# Patient Record
Sex: Male | Born: 1955
Health system: Southern US, Community
[De-identification: ages and names within clinical notes are randomized; demographics above are authoritative.]

## PROBLEM LIST (undated history)

## (undated) DIAGNOSIS — F909 Attention-deficit hyperactivity disorder, unspecified type: Secondary | ICD-10-CM

## (undated) DIAGNOSIS — E119 Type 2 diabetes mellitus without complications: Secondary | ICD-10-CM

## (undated) DIAGNOSIS — R519 Headache, unspecified: Secondary | ICD-10-CM

## (undated) DIAGNOSIS — R51 Headache: Secondary | ICD-10-CM

## (undated) DIAGNOSIS — Z87442 Personal history of urinary calculi: Secondary | ICD-10-CM

## (undated) HISTORY — PX: OTHER SURGICAL HISTORY: SHX169

---

## 1999-10-20 ENCOUNTER — Encounter: Payer: Self-pay | Admitting: Emergency Medicine

## 1999-10-20 ENCOUNTER — Emergency Department (HOSPITAL_COMMUNITY): Admission: EM | Admit: 1999-10-20 | Discharge: 1999-10-21 | Payer: Self-pay | Admitting: Emergency Medicine

## 2016-02-10 ENCOUNTER — Encounter (HOSPITAL_BASED_OUTPATIENT_CLINIC_OR_DEPARTMENT_OTHER): Payer: Self-pay | Admitting: Emergency Medicine

## 2016-02-10 DIAGNOSIS — Z23 Encounter for immunization: Secondary | ICD-10-CM

## 2016-02-10 DIAGNOSIS — K5720 Diverticulitis of large intestine with perforation and abscess without bleeding: Principal | ICD-10-CM | POA: Diagnosis present

## 2016-02-10 DIAGNOSIS — E860 Dehydration: Secondary | ICD-10-CM | POA: Diagnosis present

## 2016-02-10 DIAGNOSIS — Z79899 Other long term (current) drug therapy: Secondary | ICD-10-CM

## 2016-02-10 DIAGNOSIS — E871 Hypo-osmolality and hyponatremia: Secondary | ICD-10-CM | POA: Diagnosis present

## 2016-02-10 DIAGNOSIS — N400 Enlarged prostate without lower urinary tract symptoms: Secondary | ICD-10-CM | POA: Diagnosis present

## 2016-02-10 NOTE — ED Notes (Signed)
Pt sts he had diarrhea x3 days. Since last night not able to have bm.  Sts he went to [facility] this afternoon and they did x-rays and told him he has an obstruction. Abdominal pain but no nausea or vomiting.  Also c/o HA.

## 2016-02-11 ENCOUNTER — Inpatient Hospital Stay (HOSPITAL_BASED_OUTPATIENT_CLINIC_OR_DEPARTMENT_OTHER)
Admission: EM | Admit: 2016-02-11 | Discharge: 2016-02-14 | DRG: 392 | Disposition: A | Discharge status: Home / Self Care | Payer: Self-pay | Attending: Internal Medicine | Admitting: Internal Medicine

## 2016-02-11 ENCOUNTER — Encounter (HOSPITAL_COMMUNITY): Payer: Self-pay | Admitting: *Deleted

## 2016-02-11 ENCOUNTER — Emergency Department (HOSPITAL_BASED_OUTPATIENT_CLINIC_OR_DEPARTMENT_OTHER): Payer: Self-pay

## 2016-02-11 DIAGNOSIS — K5792 Diverticulitis of intestine, part unspecified, without perforation or abscess without bleeding: Secondary | ICD-10-CM | POA: Diagnosis present

## 2016-02-11 DIAGNOSIS — K5720 Diverticulitis of large intestine with perforation and abscess without bleeding: Principal | ICD-10-CM | POA: Diagnosis present

## 2016-02-11 HISTORY — DX: Headache, unspecified: R51.9

## 2016-02-11 HISTORY — DX: Headache: R51

## 2016-02-11 LAB — URINALYSIS, ROUTINE W REFLEX MICROSCOPIC
Bilirubin Urine: NEGATIVE
Glucose, UA: NEGATIVE mg/dL
Ketones, ur: 15 mg/dL — AB
Leukocytes, UA: NEGATIVE
Nitrite: NEGATIVE
Protein, ur: NEGATIVE mg/dL
Specific Gravity, Urine: >1.046 — ABNORMAL HIGH (ref 1.005–1.030)
pH: 7.5 (ref 5.0–8.0)

## 2016-02-11 LAB — BASIC METABOLIC PANEL
Anion gap: 8 (ref 5–15)
BUN: 16 mg/dL (ref 6–20)
CO2: 23 mmol/L (ref 22–32)
Calcium: 8.5 mg/dL — ABNORMAL LOW (ref 8.9–10.3)
Chloride: 101 mmol/L (ref 101–111)
Creatinine, Ser: 0.76 mg/dL (ref 0.61–1.24)
GFR calc Af Amer: >60 mL/min (ref >60)
GFR calc non Af Amer: >60 mL/min (ref >60)
Glucose, Bld: 133 mg/dL — ABNORMAL HIGH (ref 65–99)
Potassium: 3.8 mmol/L (ref 3.5–5.1)
Sodium: 132 mmol/L — ABNORMAL LOW (ref 135–145)

## 2016-02-11 LAB — CBC WITH DIFFERENTIAL/PLATELET
Basophils Absolute: 0 10*3/uL (ref 0.0–0.1)
Basophils Relative: 0 %
Eosinophils Absolute: 0 10*3/uL (ref 0.0–0.7)
Eosinophils Relative: 0 %
HCT: 43.3 % (ref 39.0–52.0)
Hemoglobin: 15 g/dL (ref 13.0–17.0)
Lymphocytes Relative: 11 %
Lymphs Abs: 1.7 10*3/uL (ref 0.7–4.0)
MCH: 31.1 pg (ref 26.0–34.0)
MCHC: 34.6 g/dL (ref 30.0–36.0)
MCV: 89.8 fL (ref 78.0–100.0)
Monocytes Absolute: 1.4 10*3/uL — ABNORMAL HIGH (ref 0.1–1.0)
Monocytes Relative: 9 %
Neutro Abs: 11.7 10*3/uL — ABNORMAL HIGH (ref 1.7–7.7)
Neutrophils Relative %: 80 %
Platelets: 182 10*3/uL (ref 150–400)
RBC: 4.82 MIL/uL (ref 4.22–5.81)
RDW: 13.2 % (ref 11.5–15.5)
WBC: 14.7 10*3/uL — ABNORMAL HIGH (ref 4.0–10.5)

## 2016-02-11 LAB — URINE MICROSCOPIC-ADD ON: Bacteria, UA: NONE SEEN

## 2016-02-11 LAB — MAGNESIUM: Magnesium: 1.9 mg/dL (ref 1.7–2.4)

## 2016-02-11 MED ORDER — CIPROFLOXACIN IN D5W 400 MG/200ML IV SOLN
400.0000 mg | Freq: Two times a day (BID) | INTRAVENOUS | Status: DC
  Administered 2016-02-11 – 2016-02-13 (×5): 400 mg via INTRAVENOUS
  Filled 2016-02-11 (×7): qty 200

## 2016-02-11 MED ORDER — SODIUM CHLORIDE 0.9% FLUSH
3.0000 mL | Freq: Two times a day (BID) | INTRAVENOUS | Status: DC
  Administered 2016-02-12 – 2016-02-13 (×2): 3 mL via INTRAVENOUS

## 2016-02-11 MED ORDER — DEXTROSE 5 % IV SOLN
2.0000 g | Freq: Once | INTRAVENOUS | Status: AC
  Administered 2016-02-11: 2 g via INTRAVENOUS
  Filled 2016-02-11: qty 2

## 2016-02-11 MED ORDER — FENTANYL CITRATE (PF) 100 MCG/2ML IJ SOLN
50.0000 ug | Freq: Once | INTRAMUSCULAR | Status: AC
  Administered 2016-02-11: 50 ug via INTRAVENOUS
  Filled 2016-02-11: qty 2

## 2016-02-11 MED ORDER — CETYLPYRIDINIUM CHLORIDE 0.05 % MT LIQD
7.0000 mL | Freq: Two times a day (BID) | OROMUCOSAL | Status: DC
  Administered 2016-02-11 – 2016-02-14 (×6): 7 mL via OROMUCOSAL

## 2016-02-11 MED ORDER — ONDANSETRON HCL 4 MG PO TABS: 4.0000 mg | ORAL_TABLET | Freq: Four times a day (QID) | ORAL | Status: DC | PRN

## 2016-02-11 MED ORDER — ACETAMINOPHEN 325 MG PO TABS
650.0000 mg | ORAL_TABLET | Freq: Once | ORAL | Status: AC | PRN
  Administered 2016-02-11: 650 mg via ORAL
  Filled 2016-02-11: qty 2

## 2016-02-11 MED ORDER — METRONIDAZOLE IN NACL 5-0.79 MG/ML-% IV SOLN
500.0000 mg | Freq: Once | INTRAVENOUS | Status: AC
  Administered 2016-02-11: 500 mg via INTRAVENOUS
  Filled 2016-02-11: qty 100

## 2016-02-11 MED ORDER — INFLUENZA VAC SPLIT QUAD 0.5 ML IM SUSY
0.5000 mL | PREFILLED_SYRINGE | INTRAMUSCULAR | Status: AC
  Administered 2016-02-12: 0.5 mL via INTRAMUSCULAR
  Filled 2016-02-11: qty 0.5

## 2016-02-11 MED ORDER — IOHEXOL 300 MG/ML  SOLN
100.0000 mL | Freq: Once | INTRAMUSCULAR | Status: AC | PRN
  Administered 2016-02-11: 100 mL via INTRAVENOUS

## 2016-02-11 MED ORDER — MORPHINE SULFATE (PF) 2 MG/ML IV SOLN
1.0000 mg | INTRAVENOUS | Status: DC | PRN
  Administered 2016-02-11 – 2016-02-12 (×2): 1 mg via INTRAVENOUS
  Filled 2016-02-11 (×2): qty 1

## 2016-02-11 MED ORDER — SODIUM CHLORIDE 0.9 % IV SOLN
INTRAVENOUS | Status: DC
  Administered 2016-02-11 – 2016-02-13 (×4): via INTRAVENOUS

## 2016-02-11 MED ORDER — ENOXAPARIN SODIUM 40 MG/0.4ML ~~LOC~~ SOLN
40.0000 mg | SUBCUTANEOUS | Status: DC
  Administered 2016-02-11 – 2016-02-13 (×3): 40 mg via SUBCUTANEOUS
  Filled 2016-02-11 (×3): qty 0.4

## 2016-02-11 MED ORDER — METRONIDAZOLE IN NACL 5-0.79 MG/ML-% IV SOLN
500.0000 mg | Freq: Three times a day (TID) | INTRAVENOUS | Status: DC
  Administered 2016-02-11 – 2016-02-13 (×6): 500 mg via INTRAVENOUS
  Filled 2016-02-11 (×8): qty 100

## 2016-02-11 MED ORDER — ONDANSETRON HCL 4 MG/2ML IJ SOLN: 4.0000 mg | Freq: Four times a day (QID) | INTRAMUSCULAR | Status: DC | PRN

## 2016-02-11 MED ORDER — IOHEXOL 300 MG/ML  SOLN
25.0000 mL | Freq: Once | INTRAMUSCULAR | Status: AC | PRN
  Administered 2016-02-11: 25 mL via ORAL

## 2016-02-11 MED ORDER — ONDANSETRON HCL 4 MG/2ML IJ SOLN: 4.0000 mg | Freq: Three times a day (TID) | INTRAMUSCULAR | Status: DC | PRN

## 2016-02-11 MED ORDER — ACETAMINOPHEN 650 MG RE SUPP: 650.0000 mg | Freq: Four times a day (QID) | RECTAL | Status: DC | PRN

## 2016-02-11 MED ORDER — SENNOSIDES-DOCUSATE SODIUM 8.6-50 MG PO TABS: 1.0000 | ORAL_TABLET | Freq: Every evening | ORAL | Status: DC | PRN

## 2016-02-11 MED ORDER — SODIUM CHLORIDE 0.9 % IV SOLN
INTRAVENOUS | Status: AC
  Administered 2016-02-11: 06:00:00 via INTRAVENOUS

## 2016-02-11 MED ORDER — HYDROMORPHONE HCL 1 MG/ML IJ SOLN
1.0000 mg | INTRAMUSCULAR | Status: AC | PRN
  Administered 2016-02-11 (×2): 1 mg via INTRAVENOUS
  Filled 2016-02-11 (×2): qty 1

## 2016-02-11 MED ORDER — LEVALBUTEROL HCL 0.63 MG/3ML IN NEBU: 0.6300 mg | INHALATION_SOLUTION | Freq: Four times a day (QID) | RESPIRATORY_TRACT | Status: DC | PRN

## 2016-02-11 MED ORDER — ACETAMINOPHEN 325 MG PO TABS
650.0000 mg | ORAL_TABLET | Freq: Four times a day (QID) | ORAL | Status: DC | PRN
  Administered 2016-02-12: 650 mg via ORAL
  Filled 2016-02-11: qty 2

## 2016-02-11 MED ORDER — METRONIDAZOLE IN NACL 5-0.79 MG/ML-% IV SOLN
500.0000 mg | Freq: Three times a day (TID) | INTRAVENOUS | Status: DC
  Filled 2016-02-11 (×2): qty 100

## 2016-02-11 MED ORDER — ONDANSETRON HCL 4 MG/2ML IJ SOLN
4.0000 mg | Freq: Once | INTRAMUSCULAR | Status: AC
Start: 2016-02-11 — End: 2016-02-11
  Administered 2016-02-11: 4 mg via INTRAVENOUS
  Filled 2016-02-11: qty 2

## 2016-02-11 NOTE — ED Provider Notes (Addendum)
CSN: [id number]     Arrival date & time 02/10/16  2254 History   First MD Initiated Contact with Patient 02/11/16 [phone]     Chief Complaint  Patient presents with  . Abdominal Pain     (Consider location/radiation/quality/duration/timing/severity/associated sxs/prior Treatment) HPI  This is a 60 year old male who was treated for what sounds like prostatitis in December of last year. He was treated with Flomax and an unspecified antibiotic. Since that time he has had loose stools which he does not characterizes frank diarrhea. Stools have gotten looser over the past month but has now not had a bowel movement in over a day. He is here with lower abdominal pain that began yesterday. He is having difficulty characterizing or quantifying the pain. He was seen at an urgent care where an acute abdominal series was obtained and he was told he may have a blockage. He was sent to the ED for further evaluation. He has not been nauseated or vomiting. He did have a headache that was relieved with Tylenol. He has not been passing blood. Pain is worse with movement or palpation. He was noted to have a low-grade fever on arrival. He also complains of no appetite for the last day.  Past Medical History  Diagnosis Date  . Headache    History reviewed. No pertinent past surgical history. No family history on file. Social History  Substance Use Topics  . Smoking status: Never Smoker   . Smokeless tobacco: None  . Alcohol Use: Yes     Comment: very little    Review of Systems  All other systems reviewed and are negative.   Allergies  Review of patient's allergies indicates no known allergies.  Home Medications   Prior to Admission medications   Medication Sig Start Date End Date Taking? Authorizing Provider  amphetamine-dextroamphetamine (ADDERALL) 15 MG tablet Take 15 mg by mouth 3 (three) times daily as needed.   Yes Historical Provider, MD  traZODone (DESYREL) 100 MG tablet Take 100 mg by mouth  at bedtime.   Yes Historical Provider, MD   BP 121/62 mmHg  Pulse 90  Temp(Src) 98.7 F (37.1 C) (Oral)  Resp 16  Ht 5\' 7"  (1.702 m)  Wt 203 lb (92.08 kg)  BMI 31.79 kg/m2  SpO2 97%   Physical Exam  General: Well-developed, well-nourished male in no acute distress; appearance consistent with age of record HENT: normocephalic; atraumatic Eyes: pupils equal, round and reactive to light; extraocular muscles intact Neck: supple Heart: regular rate and rhythm Lungs: clear to auscultation bilaterally Abdomen: soft; nondistended; lower abdominal tenderness; no masses or hepatosplenomegaly; bowel sounds present Extremities: No deformity; full range of motion; pulses normal Neurologic: Awake, alert and oriented; motor function intact in all extremities and symmetric; no facial droop Skin: Warm and dry Psychiatric: Normal mood and affect    ED Course  Procedures (including critical care time)   MDM   Nursing notes and vitals signs, including pulse oximetry, reviewed.  Summary of this visit's results, reviewed by myself:  Labs:  Results for orders placed or performed during the hospital encounter of 02/11/16 (from the past 24 hour(s))  CBC with Differential     Status: Abnormal   Collection Time: 02/11/16  1:20 AM  Result Value Ref Range   WBC 14.7 (H) 4.0 - 10.5 K/uL   RBC 4.82 4.22 - 5.81 MIL/uL   Hemoglobin 15.0 13.0 - 17.0 g/dL   HCT 29.5 62.1 - 30.8 %   MCV 89.8 78.0 -  100.0 fL   MCH 31.1 26.0 - 34.0 pg   MCHC 34.6 30.0 - 36.0 g/dL   RDW 16.1 09.6 - 04.5 %   Platelets 182 150 - 400 K/uL   Neutrophils Relative % 80 %   Neutro Abs 11.7 (H) 1.7 - 7.7 K/uL   Lymphocytes Relative 11 %   Lymphs Abs 1.7 0.7 - 4.0 K/uL   Monocytes Relative 9 %   Monocytes Absolute 1.4 (H) 0.1 - 1.0 K/uL   Eosinophils Relative 0 %   Eosinophils Absolute 0.0 0.0 - 0.7 K/uL   Basophils Relative 0 %   Basophils Absolute 0.0 0.0 - 0.1 K/uL  Basic metabolic panel     Status: Abnormal    Collection Time: 02/11/16  1:20 AM  Result Value Ref Range   Sodium 132 (L) 135 - 145 mmol/L   Potassium 3.8 3.5 - 5.1 mmol/L   Chloride 101 101 - 111 mmol/L   CO2 23 22 - 32 mmol/L   Glucose, Bld 133 (H) 65 - 99 mg/dL   BUN 16 6 - 20 mg/dL   Creatinine, Ser 4.09 0.61 - 1.24 mg/dL   Calcium 8.5 (L) 8.9 - 10.3 mg/dL   GFR calc non Af Amer >60 >60 mL/min   GFR calc Af Amer >60 >60 mL/min   Anion gap 8 5 - 15    Imaging Studies: Ct Abdomen Pelvis W Contrast  02/11/2016  CLINICAL DATA:  Acute onset of diarrhea. Generalized lower abdominal pain and headache. Initial encounter. EXAM: CT ABDOMEN AND PELVIS WITH CONTRAST TECHNIQUE: Multidetector CT imaging of the abdomen and pelvis was performed using the standard protocol following bolus administration of intravenous contrast. CONTRAST:  OMNIPAQUE IOHEXOL 300 MG/ML  SOLN COMPARISON:  None. FINDINGS: The visualized lung bases are clear. The liver and spleen are unremarkable in appearance. The gallbladder is within normal limits. The pancreas and adrenal glands are unremarkable. Bilateral renal cysts are noted, measuring up to 4.0 cm in size. There is no evidence of hydronephrosis. No renal or ureteral stones are seen. No perinephric stranding is appreciated. No free fluid is identified. The small bowel is unremarkable in appearance. The stomach is within normal limits. No acute vascular abnormalities are seen. Minimal calcification is noted along the abdominal aorta and its branches. The appendix is normal in caliber, without evidence of appendicitis. Focal soft tissue inflammation and wall thickening are noted at the mid sigmoid colon, with a small amount of associated free fluid, compatible with acute diverticulitis. Several small foci of free air are seen tracking proximally along the mesentery, compatible with microperforation. The bladder is mildly distended and grossly unremarkable. The prostate remains normal in size. No inguinal  lymphadenopathy is seen. No acute osseous abnormalities are identified. Mild disc space narrowing is noted at L4-L5. IMPRESSION: 1. Acute diverticulitis at the mid sigmoid colon, with focal soft tissue inflammation and wall thickening, and small amount of associated free fluid. Several small foci of free air tracking proximally along the mesentery, compatible with microperforation. 2. Bilateral renal cysts noted. These results were called by telephone at the time of interpretation on 02/11/2016 at 5:09 am to Dr. Paula Libra, who verbally acknowledged these results. Electronically Signed   By: Roanna Raider M.D.   On: 02/11/2016 05:10   5:48 AM Rocephin and Flagyl IV ordered for diverticulitis with microperforation. Dr. Della Goo of [facility] will admit the patient. General surgery has been consulted as well.    Paula Libra, MD 02/11/16 [phone]  Paula Libra, MD 02/11/16 [phone]

## 2016-02-11 NOTE — Consult Note (Signed)
Reason for Consult:  Diverticulitis with microperforation Referring Physician: N Abrol   Dale Hines is an 59 y.o. male.  HPI: Pt presented with with was thought to be prostatitis 11/2015.  He reports loose stools since that time.  Yesterday he developed lower abdominal pain and was seen at an Urgent Care.  Films reported to show a "blockage."  He presented to [facility] for evaluation. Work up shows a low grade fever 100.6, other VSS. BMP OK.  WBC is 14.7.  CT scan shows Acute diverticulitis at the mid sigmoid colon, with focal soft tissue inflammation and wall thickening, and small amount of associated free fluid. Several small foci of free air tracking proximally along the mesentery, compatible with microperforation.  He is transferred to [facility] and we are seeing in consult.  Past Medical History  Diagnosis Date  . Headache     History reviewed. No pertinent past surgical history.  No family history on file.  Social History:  reports that he has never smoked. He does not have any smokeless tobacco history on file. He reports that he drinks alcohol. He reports that he does not use illicit drugs.  Allergies: No Known Allergies  Prior to Admission medications   Medication Sig Start Date End Date Taking? Authorizing Provider  amphetamine-dextroamphetamine (ADDERALL) 15 MG tablet Take 15 mg by mouth 3 (three) times daily as needed.   Yes Historical Provider, MD  traZODone (DESYREL) 100 MG tablet Take 100 mg by mouth at bedtime.   Yes Historical Provider, MD     Results for orders placed or performed during the hospital encounter of 02/11/16 (from the past 48 hour(s))  CBC with Differential     Status: Abnormal   Collection Time: 02/11/16  1:20 AM  Result Value Ref Range   WBC 14.7 (H) 4.0 - 10.5 K/uL   RBC 4.82 4.22 - 5.81 MIL/uL   Hemoglobin 15.0 13.0 - 17.0 g/dL   HCT 43.3 39.0 - 52.0 %   MCV 89.8 78.0 - 100.0 fL   MCH 31.1 26.0 - 34.0 pg   MCHC 34.6 30.0 - 36.0 g/dL   RDW 13.2  11.5 - 15.5 %   Platelets 182 150 - 400 K/uL   Neutrophils Relative % 80 %   Neutro Abs 11.7 (H) 1.7 - 7.7 K/uL   Lymphocytes Relative 11 %   Lymphs Abs 1.7 0.7 - 4.0 K/uL   Monocytes Relative 9 %   Monocytes Absolute 1.4 (H) 0.1 - 1.0 K/uL   Eosinophils Relative 0 %   Eosinophils Absolute 0.0 0.0 - 0.7 K/uL   Basophils Relative 0 %   Basophils Absolute 0.0 0.0 - 0.1 K/uL  Basic metabolic panel     Status: Abnormal   Collection Time: 02/11/16  1:20 AM  Result Value Ref Range   Sodium 132 (L) 135 - 145 mmol/L   Potassium 3.8 3.5 - 5.1 mmol/L   Chloride 101 101 - 111 mmol/L   CO2 23 22 - 32 mmol/L   Glucose, Bld 133 (H) 65 - 99 mg/dL   BUN 16 6 - 20 mg/dL   Creatinine, Ser 0.76 0.61 - 1.24 mg/dL   Calcium 8.5 (L) 8.9 - 10.3 mg/dL   GFR calc non Af Amer >60 >60 mL/min   GFR calc Af Amer >60 >60 mL/min    Comment: (NOTE) The eGFR has been calculated using the CKD EPI equation. This calculation has not been validated in all clinical situations. eGFR's persistently <60 mL/min signify possible Chronic   Kidney Disease.    Anion gap 8 5 - 15  Urinalysis, Routine w reflex microscopic (not at [facility])     Status: Abnormal   Collection Time: 02/11/16  8:20 AM  Result Value Ref Range   Color, Urine YELLOW YELLOW   APPearance CLEAR CLEAR   Specific Gravity, Urine >1.046 (H) 1.005 - 1.030   pH 7.5 5.0 - 8.0   Glucose, UA NEGATIVE NEGATIVE mg/dL   Hgb urine dipstick TRACE (A) NEGATIVE   Bilirubin Urine NEGATIVE NEGATIVE   Ketones, ur 15 (A) NEGATIVE mg/dL   Protein, ur NEGATIVE NEGATIVE mg/dL   Nitrite NEGATIVE NEGATIVE   Leukocytes, UA NEGATIVE NEGATIVE  Urine microscopic-add on     Status: Abnormal   Collection Time: 02/11/16  8:20 AM  Result Value Ref Range   Squamous Epithelial / LPF 0-5 (A) NONE SEEN   WBC, UA 0-5 0 - 5 WBC/hpf   RBC / HPF 0-5 0 - 5 RBC/hpf   Bacteria, UA NONE SEEN NONE SEEN  Magnesium     Status: None   Collection Time: 02/11/16  8:23 AM  Result Value Ref  Range   Magnesium 1.9 1.7 - 2.4 mg/dL    Ct Abdomen Pelvis W Contrast  02/11/2016  CLINICAL DATA:  Acute onset of diarrhea. Generalized lower abdominal pain and headache. Initial encounter. EXAM: CT ABDOMEN AND PELVIS WITH CONTRAST TECHNIQUE: Multidetector CT imaging of the abdomen and pelvis was performed using the standard protocol following bolus administration of intravenous contrast. CONTRAST:  100mL OMNIPAQUE IOHEXOL 300 MG/ML  SOLN COMPARISON:  None. FINDINGS: The visualized lung bases are clear. The liver and spleen are unremarkable in appearance. The gallbladder is within normal limits. The pancreas and adrenal glands are unremarkable. Bilateral renal cysts are noted, measuring up to 4.0 cm in size. There is no evidence of hydronephrosis. No renal or ureteral stones are seen. No perinephric stranding is appreciated. No free fluid is identified. The small bowel is unremarkable in appearance. The stomach is within normal limits. No acute vascular abnormalities are seen. Minimal calcification is noted along the abdominal aorta and its branches. The appendix is normal in caliber, without evidence of appendicitis. Focal soft tissue inflammation and wall thickening are noted at the mid sigmoid colon, with a small amount of associated free fluid, compatible with acute diverticulitis. Several small foci of free air are seen tracking proximally along the mesentery, compatible with microperforation. The bladder is mildly distended and grossly unremarkable. The prostate remains normal in size. No inguinal lymphadenopathy is seen. No acute osseous abnormalities are identified. Mild disc space narrowing is noted at L4-L5. IMPRESSION: 1. Acute diverticulitis at the mid sigmoid colon, with focal soft tissue inflammation and wall thickening, and small amount of associated free fluid. Several small foci of free air tracking proximally along the mesentery, compatible with microperforation. 2. Bilateral renal cysts  noted. These results were called by telephone at the time of interpretation on 02/11/2016 at 5:09 am to Dr. JOHN MOLPUS, who verbally acknowledged these results. Electronically Signed   By: Jeffery  Chang M.D.   On: 02/11/2016 05:10    Review of Systems  Constitutional: Positive for fever. Negative for weight loss and malaise/fatigue.  Eyes: Negative.   Respiratory: Negative.   Cardiovascular: Negative.   Gastrointestinal: Positive for abdominal pain and diarrhea. Negative for heartburn, nausea, vomiting, constipation, blood in stool and melena.  Genitourinary: Negative.   Musculoskeletal: Negative.   Skin: Negative.   Neurological: Positive for headaches.  Endo/Heme/Allergies: Negative.     Psychiatric/Behavioral: Negative.    Blood pressure 128/76, pulse 94, temperature 98 F (36.7 C), temperature source Oral, resp. rate 18, height 5' 6" (1.676 m), weight 88.5 kg (195 lb 1.7 oz), SpO2 100 %. Physical Exam  Constitutional: He is oriented to person, place, and time. He appears well-developed and well-nourished. No distress.  HENT:  Head: Normocephalic and atraumatic.  Nose: Nose normal.  Eyes: Conjunctivae and EOM are normal. Right eye exhibits no discharge. Left eye exhibits no discharge. No scleral icterus.  Neck: Normal range of motion. Neck supple. No JVD present. No tracheal deviation present. No thyromegaly present.  Cardiovascular: Normal rate, regular rhythm, normal heart sounds and intact distal pulses.   No murmur heard. Respiratory: Effort normal and breath sounds normal. No respiratory distress. He has no wheezes. He has no rales. He exhibits no tenderness.  GI: Soft. He exhibits no distension and no mass. There is tenderness. There is no rebound and no guarding.  Musculoskeletal: He exhibits no edema or tenderness.  Lymphadenopathy:    He has no cervical adenopathy.  Neurological: He is alert and oriented to person, place, and time. No cranial nerve deficit.  Skin: Skin is  warm and dry. No rash noted. He is not diaphoretic. No erythema. No pallor.  Psychiatric: He has a normal mood and affect. His behavior is normal. Judgment and thought content normal.    Assessment/Plan: Acute sigmoid diverticulitis with microperforation Recent history of antibiotics for prostatitis with loose stools  Plan:  He is going to be tested for C diff colitis, and treated with antibiotics for diverticulitis .  Agree with plan and we will follow.    Hines,Dale 02/11/2016, 9:46 AM      

## 2016-02-11 NOTE — ED Notes (Signed)
[facility] here, pt resting, NAD, calm, interactive. No changes.

## 2016-02-11 NOTE — ED Notes (Signed)
Dr. Molpus in to see pt 

## 2016-02-11 NOTE — ED Notes (Signed)
Up to b/r, steady gait 

## 2016-02-11 NOTE — ED Notes (Signed)
Alert, NAD, calm, interactive, resps e/u, speaking in clear complete sentences, VSS, c/o abd pain and diarrhea (stools black), also HA. (denies: nv, fever, urinary sx, bleeding, sob, dizziness or other sx), family at [facility].

## 2016-02-11 NOTE — ED Notes (Signed)
Denies pain or nausea 

## 2016-02-11 NOTE — H&P (Addendum)
[facility] History and Physical  Dale Hines ZOX:[id number] DOB: Jun 28, 1956 DOA: 02/11/2016  Referring physician:    PCP: No PCP Per Patient   Chief Complaint:  Diarrhea 3 days  HPI:  60 year old male with no significant past medical history except for history of BPH treated with Flomax and he now presents to the ER with abdominal pain. Patient has had loose stools 3 days. Pain is described as lower left abdominal pain, associated symptoms include a low-grade fever. CT scan showed acute diverticulitis of the mid sigmoid colon with focal soft tissue inflammation and wall thickening, associated with microperforation, white count of 14,000.Patient has never had a colonoscopy.      Review of Systems: negative for the following  Constitutional: Denies fever, chills, diaphoresis, appetite change and fatigue.  HEENT: Denies photophobia, eye pain, redness, hearing loss, ear pain, congestion, sore throat, rhinorrhea, sneezing, mouth sores, trouble swallowing, neck pain, neck stiffness and tinnitus.  Respiratory: Denies SOB, DOE, cough, chest tightness, and wheezing.  Cardiovascular: Denies chest pain, palpitations and leg swelling.  Gastrointestinal: Denies nausea, vomiting, positive for abdominal pain, diarrhea, constipation, blood in stool and abdominal distention.  Genitourinary: Denies dysuria, urgency, frequency, hematuria, flank pain and difficulty urinating.  Musculoskeletal: Denies myalgias, back pain, joint swelling, arthralgias and gait problem.  Skin: Denies pallor, rash and wound.  Neurological: Denies dizziness, seizures, syncope, weakness, light-headedness, numbness and headaches.  Hematological: Denies adenopathy. Easy bruising, personal or family bleeding history  Psychiatric/Behavioral: Denies suicidal ideation, mood changes, confusion, nervousness, sleep disturbance and agitation       Past Medical History  Diagnosis Date  . Headache      History  reviewed. No pertinent past surgical history.     Social History:  reports that he has never smoked. He does not have any smokeless tobacco history on file. He reports that he drinks alcohol. He reports that he does not use illicit drugs.   No Known Allergies      FAMILY HISTORY  When questioned  Directly-patient reports  No family history of HTN, CVA ,DIABETES, TB, Cancer CAD, Bleeding Disorders, Sickle Cell, diabetes, anemia, asthma,   Prior to Admission medications   Medication Sig Start Date End Date Taking? Authorizing Provider  amphetamine-dextroamphetamine (ADDERALL) 15 MG tablet Take 15 mg by mouth 3 (three) times daily as needed.   Yes Historical Provider, MD  traZODone (DESYREL) 100 MG tablet Take 100 mg by mouth at bedtime.   Yes Historical Provider, MD     Physical Exam: Filed Vitals:   02/11/16 0550 02/11/16 0600 02/11/16 0615 02/11/16 0703  BP:  118/63 133/76 128/76  Pulse:  96 95 94  Temp: 99.7 F (37.6 C)   98 F (36.7 C)  TempSrc: Oral   Oral  Resp:    18  Height:     (1.676 m)  Weight:    88.5 kg (195 lb 1.7 oz)  SpO2:  96% 97% 100%     Constitutional: Vital signs reviewed. Patient is a well-developed and well-nourished in no acute distress and cooperative with exam. Alert and oriented x3.  Head: Normocephalic and atraumatic  Ear: TM normal bilaterally  Mouth: no erythema or exudates, MMM  Eyes: PERRL, EOMI, conjunctivae normal, No scleral icterus.  Neck: Supple, Trachea midline normal ROM, No JVD, mass, thyromegaly, or carotid bruit present.  Cardiovascular: RRR, S1 normal, S2 normal, no MRG, pulses symmetric and intact bilaterally  Pulmonary/Chest: CTAB, no wheezes, rales, or rhonchi  Abdominal: Tender to palpation in the left  lower quadrant., non-distended, bowel sounds are normal, no masses, organomegaly, or guarding present.  GU: no CVA tenderness Musculoskeletal: No joint deformities, erythema, or stiffness, ROM full and no nontender Ext: no  edema and no cyanosis, pulses palpable bilaterally (DP and PT)  Hematology: no cervical, inginal, or axillary adenopathy.  Neurological: A&O x3, Strenght is normal and symmetric bilaterally, cranial nerve II-XII are grossly intact, no focal motor deficit, sensory intact to light touch bilaterally.  Skin: Warm, dry and intact. No rash, cyanosis, or clubbing.  Psychiatric: Normal mood and affect. speech and behavior is normal. Judgment and thought content normal. Cognition and memory are normal.      Data Review   Micro Results No results found for this or any previous visit (from the past 240 hour(s)).  Radiology Reports Ct Abdomen Pelvis W Contrast  02/11/2016  CLINICAL DATA:  Acute onset of diarrhea. Generalized lower abdominal pain and headache. Initial encounter. EXAM: CT ABDOMEN AND PELVIS WITH CONTRAST TECHNIQUE: Multidetector CT imaging of the abdomen and pelvis was performed using the standard protocol following bolus administration of intravenous contrast. CONTRAST:  OMNIPAQUE IOHEXOL 300 MG/ML  SOLN COMPARISON:  None. FINDINGS: The visualized lung bases are clear. The liver and spleen are unremarkable in appearance. The gallbladder is within normal limits. The pancreas and adrenal glands are unremarkable. Bilateral renal cysts are noted, measuring up to 4.0 cm in size. There is no evidence of hydronephrosis. No renal or ureteral stones are seen. No perinephric stranding is appreciated. No free fluid is identified. The small bowel is unremarkable in appearance. The stomach is within normal limits. No acute vascular abnormalities are seen. Minimal calcification is noted along the abdominal aorta and its branches. The appendix is normal in caliber, without evidence of appendicitis. Focal soft tissue inflammation and wall thickening are noted at the mid sigmoid colon, with a small amount of associated free fluid, compatible with acute diverticulitis. Several small foci of free air are seen  tracking proximally along the mesentery, compatible with microperforation. The bladder is mildly distended and grossly unremarkable. The prostate remains normal in size. No inguinal lymphadenopathy is seen. No acute osseous abnormalities are identified. Mild disc space narrowing is noted at L4-L5. IMPRESSION: 1. Acute diverticulitis at the mid sigmoid colon, with focal soft tissue inflammation and wall thickening, and small amount of associated free fluid. Several small foci of free air tracking proximally along the mesentery, compatible with microperforation. 2. Bilateral renal cysts noted. These results were called by telephone at the time of interpretation on 02/11/2016 at 5:09 am to Dr. Paula Libra, who verbally acknowledged these results. Electronically Signed   By: Roanna Raider M.D.   On: 02/11/2016 05:10     CBC  Recent Labs Lab 02/11/16 0120  WBC 14.7*  HGB 15.0  HCT 43.3  PLT 182  MCV 89.8  MCH 31.1  MCHC 34.6  RDW 13.2  LYMPHSABS 1.7  MONOABS 1.4*  EOSABS 0.0  BASOSABS 0.0    Chemistries   Recent Labs Lab 02/11/16 0120  NA 132*  K 3.8  CL 101  CO2 23  GLUCOSE 133*  BUN 16  CREATININE 0.76  CALCIUM 8.5*   ------------------------------------------------------------------------------------------------------------------ estimated creatinine clearance is 101.1 mL/min (by C-G formula based on Cr of 0.76). ------------------------------------------------------------------------------------------------------------------ No results for input(s): HGBA1C in the last 72 hours. ------------------------------------------------------------------------------------------------------------------ No results for input(s): CHOL, HDL, LDLCALC, TRIG, CHOLHDL, LDLDIRECT in the last 72 hours. ------------------------------------------------------------------------------------------------------------------ No results for input(s): TSH, T4TOTAL, T3FREE, THYROIDAB in the last 72  hours.  Invalid input(s): FREET3 ------------------------------------------------------------------------------------------------------------------ No results for input(s): VITAMINB12, FOLATE, FERRITIN, TIBC, IRON, RETICCTPCT in the last 72 hours.  Coagulation profile No results for input(s): INR, PROTIME in the last 168 hours.  No results for input(s): DDIMER in the last 72 hours.  Cardiac Enzymes No results for input(s): CKMB, TROPONINI, MYOGLOBIN in the last 168 hours.  Invalid input(s): CK ------------------------------------------------------------------------------------------------------------------ Invalid input(s): POCBNP   CBG: No results for input(s): GLUCAP in the last 168 hours.     EKG: Independently reviewed.   Assessment/Plan Active Problems: Acute diverticulitis with microperforation Patient will be admitted to MedSurg Patient started on Rocephin and Flagyl We will continue patient on ciprofloxacin and Flagyl IV fluids, clear liquid diet Repeat CT scan if the patient's symptoms worsen during this hospitalization  general surgery consultation has been obtained Stool studies to rule out C. difficile, GI pathogen panel  Hyponatremia likely secondary to dehydration, diarrhea       Code Status Orders        Start     Ordered   02/11/16 0738  Full code   Continuous     02/11/16 0742    Code Status History    Date Active Date Inactive Code Status Order ID Comments User Context   This patient has a current code status but no historical code status.      Family Communication: bedside Disposition Plan: admit   Total time spent 55 minutes.Greater than 50% of this time was spent in counseling, explanation of diagnosis, planning of further management, and coordination of care  [facility] Pager [phone]  If 7PM-7AM, please contact night-coverage [email or web address] Password [email or web address] 02/11/2016, 8:09 AM

## 2016-02-11 NOTE — ED Notes (Signed)
Pt to CT

## 2016-02-12 LAB — COMPREHENSIVE METABOLIC PANEL
ALT: 20 U/L (ref 17–63)
ANION GAP: 9 (ref 5–15)
AST: 19 U/L (ref 15–41)
Albumin: 3.2 g/dL — ABNORMAL LOW (ref 3.5–5.0)
Alkaline Phosphatase: 76 U/L (ref 38–126)
BILIRUBIN TOTAL: 1.2 mg/dL (ref 0.3–1.2)
BUN: 10 mg/dL (ref 6–20)
CO2: 25 mmol/L (ref 22–32)
CREATININE: 0.94 mg/dL (ref 0.61–1.24)
Calcium: 8.6 mg/dL — ABNORMAL LOW (ref 8.9–10.3)
Chloride: 100 mmol/L — ABNORMAL LOW (ref 101–111)
Glucose, Bld: 103 mg/dL — ABNORMAL HIGH (ref 65–99)
POTASSIUM: 3.8 mmol/L (ref 3.5–5.1)
Sodium: 134 mmol/L — ABNORMAL LOW (ref 135–145)
Total Protein: 6.7 g/dL (ref 6.5–8.1)

## 2016-02-12 LAB — CBC
HEMATOCRIT: 42.3 % (ref 39.0–52.0)
HEMOGLOBIN: 14.1 g/dL (ref 13.0–17.0)
MCH: 29.9 pg (ref 26.0–34.0)
MCHC: 33.3 g/dL (ref 30.0–36.0)
MCV: 89.8 fL (ref 78.0–100.0)
PLATELETS: 178 10*3/uL (ref 150–400)
RBC: 4.71 MIL/uL (ref 4.22–5.81)
RDW: 13 % (ref 11.5–15.5)
WBC: 10.4 10*3/uL (ref 4.0–10.5)

## 2016-02-12 LAB — GASTROINTESTINAL PANEL BY PCR, STOOL (REPLACES STOOL CULTURE)
ADENOVIRUS F40/41: NOT DETECTED
Astrovirus: NOT DETECTED
CAMPYLOBACTER SPECIES: NOT DETECTED
CRYPTOSPORIDIUM: NOT DETECTED
CYCLOSPORA CAYETANENSIS: NOT DETECTED
E. coli O157: NOT DETECTED
ENTEROAGGREGATIVE E COLI (EAEC): NOT DETECTED
ENTEROPATHOGENIC E COLI (EPEC): NOT DETECTED
Entamoeba histolytica: NOT DETECTED
Enterotoxigenic E coli (ETEC): NOT DETECTED
GIARDIA LAMBLIA: NOT DETECTED
Norovirus GI/GII: NOT DETECTED
PLESIMONAS SHIGELLOIDES: NOT DETECTED
Rotavirus A: NOT DETECTED
Salmonella species: NOT DETECTED
Sapovirus (I, II, IV, and V): NOT DETECTED
Shiga like toxin producing E coli (STEC): NOT DETECTED
Shigella/Enteroinvasive E coli (EIEC): NOT DETECTED
VIBRIO CHOLERAE: NOT DETECTED
VIBRIO SPECIES: NOT DETECTED
YERSINIA ENTEROCOLITICA: NOT DETECTED

## 2016-02-12 LAB — C DIFFICILE QUICK SCREEN W PCR REFLEX
C DIFFICILE (CDIFF) INTERP: NEGATIVE
C Diff antigen: NEGATIVE
C Diff toxin: NEGATIVE

## 2016-02-12 LAB — URINE CULTURE: Culture: NO GROWTH

## 2016-02-12 MED ORDER — DOCUSATE SODIUM 100 MG PO CAPS
100.0000 mg | ORAL_CAPSULE | Freq: Every day | ORAL | Status: DC
  Administered 2016-02-12 – 2016-02-14 (×3): 100 mg via ORAL
  Filled 2016-02-12 (×3): qty 1

## 2016-02-12 NOTE — Progress Notes (Signed)
Utilization review completed.  

## 2016-02-12 NOTE — Progress Notes (Signed)
Subjective: No further diarrhea, tender over mid abdomen below the umbilicus.  No nausea on clears  Objective: Vital signs in last 24 hours: Temp:  [98.6 F (37 C)-100.4 F (38 C)] 98.6 F (37 C) (02/27 0540) Pulse Rate:  [81-97] 81 (02/27 0540) Resp:  [17-18] 17 (02/27 0540) BP: (119-130)/(64-72) 124/72 mmHg (02/27 0540) SpO2:  [96 %-99 %] 99 % (02/27 0540) Last BM Date: 02/08/16 PO 1540\clear diet Urine 500 recorded TM 100.4 @ 1900 Labs OK Intake/Output from previous day: 02/26 0701 - 02/27 0700 In: 3592.1 [P.O.:1540; I.V.:1702.1; IV Piggyback:300] Out: 400 [Urine:400] Intake/Output this shift:    General appearance: alert, cooperative and no distress GI: soft, tender mid lower abdomen below the umbilicus  Lab Results:   Recent Labs  02/11/16 0120 02/12/16 0553  WBC 14.7* 10.4  HGB 15.0 14.1  HCT 43.3 42.3  PLT 182 178    BMET  Recent Labs  02/11/16 0120 02/12/16 0553  NA 132* 134*  K 3.8 3.8  CL 101 100*  CO2 23 25  GLUCOSE 133* 103*  BUN 16 10  CREATININE 0.76 0.94  CALCIUM 8.5* 8.6*   PT/INR No results for input(s): LABPROT, INR in the last 72 hours.   Recent Labs Lab 02/12/16 0553  AST 19  ALT 20  ALKPHOS 76  BILITOT 1.2  PROT 6.7  ALBUMIN 3.2*     Lipase  No results found for: LIPASE   Studies/Results: Ct Abdomen Pelvis W Contrast  02/11/2016  CLINICAL DATA:  Acute onset of diarrhea. Generalized lower abdominal pain and headache. Initial encounter. EXAM: CT ABDOMEN AND PELVIS WITH CONTRAST TECHNIQUE: Multidetector CT imaging of the abdomen and pelvis was performed using the standard protocol following bolus administration of intravenous contrast. CONTRAST:  OMNIPAQUE IOHEXOL 300 MG/ML  SOLN COMPARISON:  None. FINDINGS: The visualized lung bases are clear. The liver and spleen are unremarkable in appearance. The gallbladder is within normal limits. The pancreas and adrenal glands are unremarkable. Bilateral renal cysts are  noted, measuring up to 4.0 cm in size. There is no evidence of hydronephrosis. No renal or ureteral stones are seen. No perinephric stranding is appreciated. No free fluid is identified. The small bowel is unremarkable in appearance. The stomach is within normal limits. No acute vascular abnormalities are seen. Minimal calcification is noted along the abdominal aorta and its branches. The appendix is normal in caliber, without evidence of appendicitis. Focal soft tissue inflammation and wall thickening are noted at the mid sigmoid colon, with a small amount of associated free fluid, compatible with acute diverticulitis. Several small foci of free air are seen tracking proximally along the mesentery, compatible with microperforation. The bladder is mildly distended and grossly unremarkable. The prostate remains normal in size. No inguinal lymphadenopathy is seen. No acute osseous abnormalities are identified. Mild disc space narrowing is noted at L4-L5. IMPRESSION: 1. Acute diverticulitis at the mid sigmoid colon, with focal soft tissue inflammation and wall thickening, and small amount of associated free fluid. Several small foci of free air tracking proximally along the mesentery, compatible with microperforation. 2. Bilateral renal cysts noted. These results were called by telephone at the time of interpretation on 02/11/2016 at 5:09 am to Dr. Paula Libra, who verbally acknowledged these results. Electronically Signed   By: Roanna Raider M.D.   On: 02/11/2016 05:10    Medications: . antiseptic oral rinse  7 mL Mouth Rinse BID  . ciprofloxacin  400 mg Intravenous Q12H  . enoxaparin (LOVENOX) injection  40 mg Subcutaneous Q24H  . Influenza vac split quadrivalent PF  0.5 mL Intramuscular Tomorrow-1000  . metronidazole  500 mg Intravenous Q8H  . sodium chloride flush  3 mL Intravenous Q12H    Assessment/Plan Acute sigmoid diverticulitis with microperforation Recent history of antibiotics for prostatitis  with loose stools Antibiotics:  Cipro/flagyl day 2 DVT: Lovenox/SCD   Plan:  Continue antibiotics and clears.       LOS: 1 day    Dale Hines 02/12/2016

## 2016-02-12 NOTE — Progress Notes (Signed)
[facility] PROGRESS NOTE  Dale Hines JXB:[id number] DOB: 12-Apr-1956 DOA: 02/11/2016 PCP: No PCP Per Patient  Length of stay: 1   Assessment/Plan: Active Problems:   Diverticulitis of colon with perforation   Acute diverticulitis   HPI:  60 year old male with no significant past medical history except for history of BPH treated with Flomax and he now presents to the ER with abdominal pain. Patient has had loose stools 3 days. Pain is described as lower left abdominal pain, associated symptoms include a low-grade fever. CT scan showed acute diverticulitis of the mid sigmoid colon with focal soft tissue inflammation and wall thickening, associated with microperforation, white count of 14,000.Patient has never had a colonoscopy.  Assessment and plan  Acute diverticulitis with microperforation Continue MedSurg Continue Rocephin and Flagyl IV fluids, clear liquid diet, surgery does not want to advance, no BM in 3 days Repeat CT scan if the patient's symptoms worsen during this hospitalization General surgery following GI pathogen panel negative White blood cell count improving  Hyponatremia likely secondary to dehydration, diarrhea, improving    DVT prophylaxsis Lovenox  Code Status:      Code Status Orders        Start     Ordered   02/11/16 0738  Full code   Continuous     02/11/16 0742    Code Status History    Date Active Date Inactive Code Status Order ID Comments User Context   This patient has a current code status but no historical code status.      Family Communication: Discussed in detail with the patient, all imaging results, lab results explained to the patient   Disposition Plan:  Pending surgery recommendations     Consultants:  None   Procedures:  None   Antibiotics: Anti-infectives    Start     Dose/Rate Route Frequency Ordered Stop   02/11/16 1400  metroNIDAZOLE (FLAGYL) IVPB 500 mg     500 mg 100 mL/hr over 60 Minutes  Intravenous Every 8 hours 02/11/16 0804     02/11/16 0800  ciprofloxacin (CIPRO) IVPB 400 mg     400 mg 200 mL/hr over 60 Minutes Intravenous Every 12 hours 02/11/16 0750     02/11/16 0800  metroNIDAZOLE (FLAGYL) IVPB 500 mg  Status:  Discontinued     500 mg 100 mL/hr over 60 Minutes Intravenous Every 8 hours 02/11/16 0750 02/11/16 0804   02/11/16 0515  cefTRIAXone (ROCEPHIN) 2 g in dextrose 5 % 50 mL IVPB     2 g 100 mL/hr over 30 Minutes Intravenous  Once 02/11/16 0513 02/11/16 0609   02/11/16 0515  metroNIDAZOLE (FLAGYL) IVPB 500 mg     500 mg 100 mL/hr over 60 Minutes Intravenous  Once 02/11/16 0513 02/11/16 0713         HPI/Subjective: No further diarrhea, tender over mid abdomen below the umbilicus. No nausea on clears  Objective: Filed Vitals:   02/11/16 0703 02/11/16 1330 02/11/16 2148 02/12/16 0540  BP: 128/76 130/69 119/64 124/72  Pulse: 94 90 97 81  Temp: 98 F (36.7 C) 98.6 F (37 C) 100.4 F (38 C) 98.6 F (37 C)  TempSrc: Oral Oral Oral Oral  Resp: 18 18 17 17   Height: 5\' 6"  (1.676 m)     Weight: 88.5 kg (195 lb 1.7 oz)     SpO2: 100% 96% 98% 99%    Intake/Output Summary (Last 24 hours) at 02/12/16 1405 Last data filed at 02/12/16  0900  Gross per 24 hour  Intake 2752.08 ml  Output    400 ml  Net 2352.08 ml    Exam:  General: No acute respiratory distress Lungs: Clear to auscultation bilaterally without wheezes or crackles Cardiovascular: Regular rate and rhythm without murmur gallop or rub normal S1 and S2 Abdomen: Nontender, nondistended, soft, bowel sounds positive, no rebound, no ascites, no appreciable mass Extremities: No significant cyanosis, clubbing, or edema bilateral lower extremities     Data Review   Micro Results Recent Results (from the past 240 hour(s))  Urine culture     Status: None   Collection Time: 02/11/16  8:20 AM  Result Value Ref Range Status   Specimen Description URINE, RANDOM  Final   Special Requests NONE   Final   Culture NO GROWTH 1 DAY  Final   Report Status 02/12/2016 FINAL  Final  Gastrointestinal Panel by PCR , Stool     Status: None   Collection Time: 02/12/16 10:02 AM  Result Value Ref Range Status   Campylobacter species NOT DETECTED NOT DETECTED Final   Plesimonas shigelloides NOT DETECTED NOT DETECTED Final   Salmonella species NOT DETECTED NOT DETECTED Final   Yersinia enterocolitica NOT DETECTED NOT DETECTED Final   Vibrio species NOT DETECTED NOT DETECTED Final   Vibrio cholerae NOT DETECTED NOT DETECTED Final   Enteroaggregative E coli (EAEC) NOT DETECTED NOT DETECTED Final   Enteropathogenic E coli (EPEC) NOT DETECTED NOT DETECTED Final   Enterotoxigenic E coli (ETEC) NOT DETECTED NOT DETECTED Final   Shiga like toxin producing E coli (STEC) NOT DETECTED NOT DETECTED Final   E. coli O157 NOT DETECTED NOT DETECTED Final   Shigella/Enteroinvasive E coli (EIEC) NOT DETECTED NOT DETECTED Final   Cryptosporidium NOT DETECTED NOT DETECTED Final   Cyclospora cayetanensis NOT DETECTED NOT DETECTED Final   Entamoeba histolytica NOT DETECTED NOT DETECTED Final   Giardia lamblia NOT DETECTED NOT DETECTED Final   Adenovirus F40/41 NOT DETECTED NOT DETECTED Final   Astrovirus NOT DETECTED NOT DETECTED Final   Norovirus GI/GII NOT DETECTED NOT DETECTED Final   Rotavirus A NOT DETECTED NOT DETECTED Final   Sapovirus (I, II, IV, and V) NOT DETECTED NOT DETECTED Final    Radiology Reports Ct Abdomen Pelvis W Contrast  02/11/2016  CLINICAL DATA:  Acute onset of diarrhea. Generalized lower abdominal pain and headache. Initial encounter. EXAM: CT ABDOMEN AND PELVIS WITH CONTRAST TECHNIQUE: Multidetector CT imaging of the abdomen and pelvis was performed using the standard protocol following bolus administration of intravenous contrast. CONTRAST:  OMNIPAQUE IOHEXOL 300 MG/ML  SOLN COMPARISON:  None. FINDINGS: The visualized lung bases are clear. The liver and spleen are unremarkable in  appearance. The gallbladder is within normal limits. The pancreas and adrenal glands are unremarkable. Bilateral renal cysts are noted, measuring up to 4.0 cm in size. There is no evidence of hydronephrosis. No renal or ureteral stones are seen. No perinephric stranding is appreciated. No free fluid is identified. The small bowel is unremarkable in appearance. The stomach is within normal limits. No acute vascular abnormalities are seen. Minimal calcification is noted along the abdominal aorta and its branches. The appendix is normal in caliber, without evidence of appendicitis. Focal soft tissue inflammation and wall thickening are noted at the mid sigmoid colon, with a small amount of associated free fluid, compatible with acute diverticulitis. Several small foci of free air are seen tracking proximally along the mesentery, compatible with microperforation. The bladder is mildly  distended and grossly unremarkable. The prostate remains normal in size. No inguinal lymphadenopathy is seen. No acute osseous abnormalities are identified. Mild disc space narrowing is noted at L4-L5. IMPRESSION: 1. Acute diverticulitis at the mid sigmoid colon, with focal soft tissue inflammation and wall thickening, and small amount of associated free fluid. Several small foci of free air tracking proximally along the mesentery, compatible with microperforation. 2. Bilateral renal cysts noted. These results were called by telephone at the time of interpretation on 02/11/2016 at 5:09 am to Dr. Paula Libra, who verbally acknowledged these results. Electronically Signed   By: Roanna Raider M.D.   On: 02/11/2016 05:10     CBC  Recent Labs Lab 02/11/16 0120 02/12/16 0553  WBC 14.7* 10.4  HGB 15.0 14.1  HCT 43.3 42.3  PLT 182 178  MCV 89.8 89.8  MCH 31.1 29.9  MCHC 34.6 33.3  RDW 13.2 13.0  LYMPHSABS 1.7  --   MONOABS 1.4*  --   EOSABS 0.0  --   BASOSABS 0.0  --     Chemistries   Recent Labs Lab 02/11/16 0120  02/11/16 0823 02/12/16 0553  NA 132*  --  134*  K 3.8  --  3.8  CL 101  --  100*  CO2 23  --  25  GLUCOSE 133*  --  103*  BUN 16  --  10  CREATININE 0.76  --  0.94  CALCIUM 8.5*  --  8.6*  MG  --  1.9  --   AST  --   --  19  ALT  --   --  20  ALKPHOS  --   --  76  BILITOT  --   --  1.2   ------------------------------------------------------------------------------------------------------------------ estimated creatinine clearance is 86 mL/min (by C-G formula based on Cr of 0.94). ------------------------------------------------------------------------------------------------------------------ No results for input(s): HGBA1C in the last 72 hours. ------------------------------------------------------------------------------------------------------------------ No results for input(s): CHOL, HDL, LDLCALC, TRIG, CHOLHDL, LDLDIRECT in the last 72 hours. ------------------------------------------------------------------------------------------------------------------ No results for input(s): TSH, T4TOTAL, T3FREE, THYROIDAB in the last 72 hours.  Invalid input(s): FREET3 ------------------------------------------------------------------------------------------------------------------ No results for input(s): VITAMINB12, FOLATE, FERRITIN, TIBC, IRON, RETICCTPCT in the last 72 hours.  Coagulation profile No results for input(s): INR, PROTIME in the last 168 hours.  No results for input(s): DDIMER in the last 72 hours.  Cardiac Enzymes No results for input(s): CKMB, TROPONINI, MYOGLOBIN in the last 168 hours.  Invalid input(s): CK ------------------------------------------------------------------------------------------------------------------ Invalid input(s): POCBNP   CBG: No results for input(s): GLUCAP in the last 168 hours.     Studies: Ct Abdomen Pelvis W Contrast  02/11/2016  CLINICAL DATA:  Acute onset of diarrhea. Generalized lower abdominal pain and headache.  Initial encounter. EXAM: CT ABDOMEN AND PELVIS WITH CONTRAST TECHNIQUE: Multidetector CT imaging of the abdomen and pelvis was performed using the standard protocol following bolus administration of intravenous contrast. CONTRAST:  OMNIPAQUE IOHEXOL 300 MG/ML  SOLN COMPARISON:  None. FINDINGS: The visualized lung bases are clear. The liver and spleen are unremarkable in appearance. The gallbladder is within normal limits. The pancreas and adrenal glands are unremarkable. Bilateral renal cysts are noted, measuring up to 4.0 cm in size. There is no evidence of hydronephrosis. No renal or ureteral stones are seen. No perinephric stranding is appreciated. No free fluid is identified. The small bowel is unremarkable in appearance. The stomach is within normal limits. No acute vascular abnormalities are seen. Minimal calcification is noted along the abdominal aorta and its branches. The appendix  is normal in caliber, without evidence of appendicitis. Focal soft tissue inflammation and wall thickening are noted at the mid sigmoid colon, with a small amount of associated free fluid, compatible with acute diverticulitis. Several small foci of free air are seen tracking proximally along the mesentery, compatible with microperforation. The bladder is mildly distended and grossly unremarkable. The prostate remains normal in size. No inguinal lymphadenopathy is seen. No acute osseous abnormalities are identified. Mild disc space narrowing is noted at L4-L5. IMPRESSION: 1. Acute diverticulitis at the mid sigmoid colon, with focal soft tissue inflammation and wall thickening, and small amount of associated free fluid. Several small foci of free air tracking proximally along the mesentery, compatible with microperforation. 2. Bilateral renal cysts noted. These results were called by telephone at the time of interpretation on 02/11/2016 at 5:09 am to Dr. Paula Libra, who verbally acknowledged these results. Electronically  Signed   By: Roanna Raider M.D.   On: 02/11/2016 05:10      No results found for: HGBA1C Lab Results  Component Value Date   CREATININE 0.94 02/12/2016       Scheduled Meds: . antiseptic oral rinse  7 mL Mouth Rinse BID  . ciprofloxacin  400 mg Intravenous Q12H  . docusate sodium  100 mg Oral Daily  . enoxaparin (LOVENOX) injection  40 mg Subcutaneous Q24H  . metronidazole  500 mg Intravenous Q8H  . sodium chloride flush  3 mL Intravenous Q12H   Continuous Infusions: . sodium chloride 100 mL/hr at 02/11/16 2210    Active Problems:   Diverticulitis of colon with perforation   Acute diverticulitis    Time spent: 45 minutes   [facility]  [facility] Pager [phone]. If 7PM-7AM, please contact night-coverage at [email or web address], password [facility] 02/12/2016, 2:05 PM  LOS: 1 day

## 2016-02-13 LAB — BASIC METABOLIC PANEL
ANION GAP: 6 (ref 5–15)
BUN: 11 mg/dL (ref 6–20)
CALCIUM: 8.8 mg/dL — AB (ref 8.9–10.3)
CHLORIDE: 107 mmol/L (ref 101–111)
CO2: 26 mmol/L (ref 22–32)
Creatinine, Ser: 0.86 mg/dL (ref 0.61–1.24)
GFR calc non Af Amer: >60 mL/min (ref >60)
Glucose, Bld: 103 mg/dL — ABNORMAL HIGH (ref 65–99)
POTASSIUM: 3.7 mmol/L (ref 3.5–5.1)
Sodium: 139 mmol/L (ref 135–145)

## 2016-02-13 MED ORDER — CIPROFLOXACIN HCL 500 MG PO TABS
500.0000 mg | ORAL_TABLET | Freq: Two times a day (BID) | ORAL | Status: DC
  Administered 2016-02-13 – 2016-02-14 (×3): 500 mg via ORAL
  Filled 2016-02-13 (×3): qty 1

## 2016-02-13 MED ORDER — METRONIDAZOLE 500 MG PO TABS
500.0000 mg | ORAL_TABLET | Freq: Three times a day (TID) | ORAL | Status: DC
  Administered 2016-02-13 – 2016-02-14 (×3): 500 mg via ORAL
  Filled 2016-02-13 (×3): qty 1

## 2016-02-13 NOTE — Progress Notes (Signed)
[facility] PROGRESS NOTE  Dale Hines ZOX:[id number] DOB: 1956-06-09 DOA: 02/11/2016 PCP: No PCP Per Patient  Length of stay: 2   Assessment/Plan: Active Problems:   Diverticulitis of colon with perforation   Acute diverticulitis   HPI:  60 year old male with no significant past medical history except for history of BPH treated with Flomax and he now presents to the ER with abdominal pain. Patient has had loose stools 3 days. Pain is described as lower left abdominal pain, associated symptoms include a low-grade fever. CT scan showed acute diverticulitis of the mid sigmoid colon with focal soft tissue inflammation and wall thickening, associated with microperforation, white count of 14,000.Patient has never had a colonoscopy.  Assessment and plan  Acute diverticulitis with microperforation Continue MedSurg Change IV Cipro and Flagyl to oral Status post 3 bowel movements yesterday, patient hungry requesting for foot, would like to try a soft diet Repeat CT scan if the patient's symptoms worsen during this hospitalization General surgery following GI pathogen panel negative White blood cell count improving Anticipate discharge tomorrow if okay with surgery  Hyponatremia likely secondary to dehydration, diarrhea, improving    DVT prophylaxsis Lovenox  Code Status:      Code Status Orders        Start     Ordered   02/11/16 0738  Full code   Continuous     02/11/16 0742    Code Status History    Date Active Date Inactive Code Status Order ID Comments User Context   This patient has a current code status but no historical code status.      Family Communication: Discussed in detail with the patient, all imaging results, lab results explained to the patient   Disposition Plan:  Anticipate discharge tomorrow if okay with surgery    Consultants:  None   Procedures:  None   Antibiotics: Anti-infectives    Start     Dose/Rate Route Frequency  Ordered Stop   02/13/16 1400  metroNIDAZOLE (FLAGYL) tablet 500 mg     500 mg Oral 3 times per day 02/13/16 1259     02/13/16 1300  ciprofloxacin (CIPRO) tablet 500 mg     500 mg Oral 2 times daily 02/13/16 1259     02/11/16 1400  metroNIDAZOLE (FLAGYL) IVPB 500 mg  Status:  Discontinued     500 mg 100 mL/hr over 60 Minutes Intravenous Every 8 hours 02/11/16 0804 02/13/16 1259   02/11/16 0800  ciprofloxacin (CIPRO) IVPB 400 mg  Status:  Discontinued     400 mg 200 mL/hr over 60 Minutes Intravenous Every 12 hours 02/11/16 0750 02/13/16 1259   02/11/16 0800  metroNIDAZOLE (FLAGYL) IVPB 500 mg  Status:  Discontinued     500 mg 100 mL/hr over 60 Minutes Intravenous Every 8 hours 02/11/16 0750 02/11/16 0804   02/11/16 0515  cefTRIAXone (ROCEPHIN) 2 g in dextrose 5 % 50 mL IVPB     2 g 100 mL/hr over 30 Minutes Intravenous  Once 02/11/16 0513 02/11/16 0609   02/11/16 0515  metroNIDAZOLE (FLAGYL) IVPB 500 mg     500 mg 100 mL/hr over 60 Minutes Intravenous  Once 02/11/16 0513 02/11/16 0713         HPI/Subjective:  . No nausea on full liquids    Objective: Filed Vitals:   02/12/16 0540 02/12/16 1546 02/12/16 2248 02/13/16 0632  BP: 124/72 128/70 128/74 134/72  Pulse: 81 80 71 82  Temp: 98.6 F (37  C) 98.8 F (37.1 C) 98.7 F (37.1 C) 99.1 F (37.3 C)  TempSrc: Oral Oral Oral Oral  Resp: Height:      Weight:      SpO2: 99% 100% 100% 97%    Intake/Output Summary (Last 24 hours) at 02/13/16 1300 Last data filed at 02/13/16 1610  Gross per 24 hour  Intake 5191.67 ml  Output      0 ml  Net 5191.67 ml    Exam:  General: No acute respiratory distress Lungs: Clear to auscultation bilaterally without wheezes or crackles Cardiovascular: Regular rate and rhythm without murmur gallop or rub normal S1 and S2 Abdomen: Nontender, nondistended, soft, bowel sounds positive, no rebound, no ascites, no appreciable mass Extremities: No significant cyanosis, clubbing, or  edema bilateral lower extremities     Data Review   Micro Results Recent Results (from the past 240 hour(s))  Urine culture     Status: None   Collection Time: 02/11/16  8:20 AM  Result Value Ref Range Status   Specimen Description URINE, RANDOM  Final   Special Requests NONE  Final   Culture NO GROWTH 1 DAY  Final   Report Status 02/12/2016 FINAL  Final  Gastrointestinal Panel by PCR , Stool     Status: None   Collection Time: 02/12/16 10:02 AM  Result Value Ref Range Status   Campylobacter species NOT DETECTED NOT DETECTED Final   Plesimonas shigelloides NOT DETECTED NOT DETECTED Final   Salmonella species NOT DETECTED NOT DETECTED Final   Yersinia enterocolitica NOT DETECTED NOT DETECTED Final   Vibrio species NOT DETECTED NOT DETECTED Final   Vibrio cholerae NOT DETECTED NOT DETECTED Final   Enteroaggregative E coli (EAEC) NOT DETECTED NOT DETECTED Final   Enteropathogenic E coli (EPEC) NOT DETECTED NOT DETECTED Final   Enterotoxigenic E coli (ETEC) NOT DETECTED NOT DETECTED Final   Shiga like toxin producing E coli (STEC) NOT DETECTED NOT DETECTED Final   E. coli O157 NOT DETECTED NOT DETECTED Final   Shigella/Enteroinvasive E coli (EIEC) NOT DETECTED NOT DETECTED Final   Cryptosporidium NOT DETECTED NOT DETECTED Final   Cyclospora cayetanensis NOT DETECTED NOT DETECTED Final   Entamoeba histolytica NOT DETECTED NOT DETECTED Final   Giardia lamblia NOT DETECTED NOT DETECTED Final   Adenovirus F40/41 NOT DETECTED NOT DETECTED Final   Astrovirus NOT DETECTED NOT DETECTED Final   Norovirus GI/GII NOT DETECTED NOT DETECTED Final   Rotavirus A NOT DETECTED NOT DETECTED Final   Sapovirus (I, II, IV, and V) NOT DETECTED NOT DETECTED Final  C difficile quick scan w PCR reflex     Status: None   Collection Time: 02/12/16 10:02 AM  Result Value Ref Range Status   C Diff antigen NEGATIVE NEGATIVE Final   C Diff toxin NEGATIVE NEGATIVE Final   C Diff interpretation Negative for  toxigenic C. difficile  Final    Radiology Reports Ct Abdomen Pelvis W Contrast  02/11/2016  CLINICAL DATA:  Acute onset of diarrhea. Generalized lower abdominal pain and headache. Initial encounter. EXAM: CT ABDOMEN AND PELVIS WITH CONTRAST TECHNIQUE: Multidetector CT imaging of the abdomen and pelvis was performed using the standard protocol following bolus administration of intravenous contrast. CONTRAST:  OMNIPAQUE IOHEXOL 300 MG/ML  SOLN COMPARISON:  None. FINDINGS: The visualized lung bases are clear. The liver and spleen are unremarkable in appearance. The gallbladder is within normal limits. The pancreas and adrenal glands are unremarkable. Bilateral renal cysts are noted,  measuring up to 4.0 cm in size. There is no evidence of hydronephrosis. No renal or ureteral stones are seen. No perinephric stranding is appreciated. No free fluid is identified. The small bowel is unremarkable in appearance. The stomach is within normal limits. No acute vascular abnormalities are seen. Minimal calcification is noted along the abdominal aorta and its branches. The appendix is normal in caliber, without evidence of appendicitis. Focal soft tissue inflammation and wall thickening are noted at the mid sigmoid colon, with a small amount of associated free fluid, compatible with acute diverticulitis. Several small foci of free air are seen tracking proximally along the mesentery, compatible with microperforation. The bladder is mildly distended and grossly unremarkable. The prostate remains normal in size. No inguinal lymphadenopathy is seen. No acute osseous abnormalities are identified. Mild disc space narrowing is noted at L4-L5. IMPRESSION: 1. Acute diverticulitis at the mid sigmoid colon, with focal soft tissue inflammation and wall thickening, and small amount of associated free fluid. Several small foci of free air tracking proximally along the mesentery, compatible with microperforation. 2. Bilateral renal  cysts noted. These results were called by telephone at the time of interpretation on 02/11/2016 at 5:09 am to Dr. Paula Libra, who verbally acknowledged these results. Electronically Signed   By: Roanna Raider M.D.   On: 02/11/2016 05:10     CBC  Recent Labs Lab 02/11/16 0120 02/12/16 0553  WBC 14.7* 10.4  HGB 15.0 14.1  HCT 43.3 42.3  PLT 182 178  MCV 89.8 89.8  MCH 31.1 29.9  MCHC 34.6 33.3  RDW 13.2 13.0  LYMPHSABS 1.7  --   MONOABS 1.4*  --   EOSABS 0.0  --   BASOSABS 0.0  --     Chemistries   Recent Labs Lab 02/11/16 0120 02/11/16 0823 02/12/16 0553 02/13/16 0655  NA 132*  --  134* 139  K 3.8  --  3.8 3.7  CL 101  --  100* 107  CO2 23  --  25 26  GLUCOSE 133*  --  103* 103*  BUN 16  --  10 11  CREATININE 0.76  --  0.94 0.86  CALCIUM 8.5*  --  8.6* 8.8*  MG  --  1.9  --   --   AST  --   --  19  --   ALT  --   --  20  --   ALKPHOS  --   --  76  --   BILITOT  --   --  1.2  --    ------------------------------------------------------------------------------------------------------------------ estimated creatinine clearance is 94 mL/min (by C-G formula based on Cr of 0.86). ------------------------------------------------------------------------------------------------------------------ No results for input(s): HGBA1C in the last 72 hours. ------------------------------------------------------------------------------------------------------------------ No results for input(s): CHOL, HDL, LDLCALC, TRIG, CHOLHDL, LDLDIRECT in the last 72 hours. ------------------------------------------------------------------------------------------------------------------ No results for input(s): TSH, T4TOTAL, T3FREE, THYROIDAB in the last 72 hours.  Invalid input(s): FREET3 ------------------------------------------------------------------------------------------------------------------ No results for input(s): VITAMINB12, FOLATE, FERRITIN, TIBC, IRON, RETICCTPCT in the last  72 hours.  Coagulation profile No results for input(s): INR, PROTIME in the last 168 hours.  No results for input(s): DDIMER in the last 72 hours.  Cardiac Enzymes No results for input(s): CKMB, TROPONINI, MYOGLOBIN in the last 168 hours.  Invalid input(s): CK ------------------------------------------------------------------------------------------------------------------ Invalid input(s): POCBNP   CBG: No results for input(s): GLUCAP in the last 168 hours.     Studies: No results found.    No results found for: HGBA1C Lab Results  Component Value Date  CREATININE 0.86 02/13/2016       Scheduled Meds: . antiseptic oral rinse  7 mL Mouth Rinse BID  . ciprofloxacin  500 mg Oral BID  . docusate sodium  100 mg Oral Daily  . enoxaparin (LOVENOX) injection  40 mg Subcutaneous Q24H  . metroNIDAZOLE  500 mg Oral 3 times per day  . sodium chloride flush  3 mL Intravenous Q12H   Continuous Infusions: . sodium chloride 100 mL/hr at 02/13/16 6578    Active Problems:   Diverticulitis of colon with perforation   Acute diverticulitis    Time spent: 45 minutes   [facility]  [facility] Pager [phone]. If 7PM-7AM, please contact night-coverage at [email or web address], password [facility] 02/13/2016, 1:00 PM  LOS: 2 days

## 2016-02-13 NOTE — Progress Notes (Signed)
  Subjective: Pain is much improved, no pain this AM.  He is doing fine with clears.  He would like to go home as soon as he can.  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  Objective: Vital signs in last 24 hours: Temp:  [98.7 F (37.1 C)-99.1 F (37.3 C)] 99.1 F (37.3 C) (02/28 1610) Pulse Rate:  [71-82] 82 (02/28 0632) Resp:  [17-18] 17 (02/28 0632) BP: (128-134)/(70-74) 134/72 mmHg (02/28 0632) SpO2:  [97 %-100 %] 97 % (02/28 0632) Last BM Date: 02/12/16 920 PO clears Stools x 3 recorded, no urine recorded Afebrile, VSS  BMP OK  No CBC this AM Intake/Output from previous day: 02/27 0701 - 02/28 0700 In: 4831.7 [P.O.:920; I.V.:3911.7] Out: -  Intake/Output this shift: Total I/O In: 600 [P.O.:600] Out: -   General appearance: alert, cooperative and no distress GI: soft, non-tender; bowel sounds normal; no masses,  no organomegaly  Lab Results:   Recent Labs  02/11/16 0120 02/12/16 0553  WBC 14.7* 10.4  HGB 15.0 14.1  HCT 43.3 42.3  PLT 182 178    BMET  Recent Labs  02/12/16 0553 02/13/16 0655  NA 134* 139  K 3.8 3.7  CL 100* 107  CO2 25 26  GLUCOSE 103* 103*  BUN 10 11  CREATININE 0.94 0.86  CALCIUM 8.6* 8.8*   PT/INR No results for input(s): LABPROT, INR in the last 72 hours.   Recent Labs Lab 02/12/16 0553  AST 19  ALT 20  ALKPHOS 76  BILITOT 1.2  PROT 6.7  ALBUMIN 3.2*     Lipase  No results found for: LIPASE   Studies/Results: No results found.  Medications: . antiseptic oral rinse  7 mL Mouth Rinse BID  . ciprofloxacin  400 mg Intravenous Q12H  . docusate sodium  100 mg Oral Daily  . enoxaparin (LOVENOX) injection  40 mg Subcutaneous Q24H  . metronidazole  500 mg Intravenous Q8H  . sodium chloride flush  3 mL Intravenous Q12H    Assessment/Plan Acute sigmoid diverticulitis with microperforation Recent history of antibiotics for prostatitis with loose stools Antibiotics: Cipro/flagyl day 3 DVT: Lovenox/SCD     Plan:  Full liquids, continue antibiotics.  I would keep him another day, but will discuss with Dr. Derrell Lolling.   LOS: 2 days    Adelma Bowdoin 02/13/2016

## 2016-02-14 LAB — COMPREHENSIVE METABOLIC PANEL
ALBUMIN: 3.3 g/dL — AB (ref 3.5–5.0)
ALT: 28 U/L (ref 17–63)
AST: 29 U/L (ref 15–41)
Alkaline Phosphatase: 73 U/L (ref 38–126)
Anion gap: 9 (ref 5–15)
BUN: 15 mg/dL (ref 6–20)
CHLORIDE: 105 mmol/L (ref 101–111)
CO2: 25 mmol/L (ref 22–32)
Calcium: 9.2 mg/dL (ref 8.9–10.3)
Creatinine, Ser: 0.9 mg/dL (ref 0.61–1.24)
GFR calc Af Amer: >60 mL/min (ref >60)
GFR calc non Af Amer: >60 mL/min (ref >60)
GLUCOSE: 106 mg/dL — AB (ref 65–99)
POTASSIUM: 3.7 mmol/L (ref 3.5–5.1)
Sodium: 139 mmol/L (ref 135–145)
TOTAL PROTEIN: 6.6 g/dL (ref 6.5–8.1)
Total Bilirubin: 0.6 mg/dL (ref 0.3–1.2)

## 2016-02-14 LAB — CBC
HCT: 43.2 % (ref 39.0–52.0)
Hemoglobin: 14.8 g/dL (ref 13.0–17.0)
MCH: 30.2 pg (ref 26.0–34.0)
MCHC: 34.3 g/dL (ref 30.0–36.0)
MCV: 88.2 fL (ref 78.0–100.0)
PLATELETS: 236 10*3/uL (ref 150–400)
RBC: 4.9 MIL/uL (ref 4.22–5.81)
RDW: 12.8 % (ref 11.5–15.5)
WBC: 5.9 10*3/uL (ref 4.0–10.5)

## 2016-02-14 MED ORDER — DOCUSATE SODIUM 100 MG PO CAPS: 100.0000 mg | ORAL_CAPSULE | Freq: Two times a day (BID) | ORAL | Status: DC

## 2016-02-14 MED ORDER — CIPROFLOXACIN HCL 500 MG PO TABS: 500.0000 mg | ORAL_TABLET | Freq: Two times a day (BID) | ORAL | Status: DC

## 2016-02-14 MED ORDER — METRONIDAZOLE 500 MG PO TABS: 500.0000 mg | ORAL_TABLET | Freq: Three times a day (TID) | ORAL | Status: DC

## 2016-02-14 NOTE — Care Management Note (Signed)
Case Management Note  Patient Details  Name: Dale Hines MRN: [id number] Date of Birth: 08/07/1956  Subjective/Objective:                    Action/Plan:  MATCH letter given and explained .   Scheduled appointment at [facility] and [facility] Friday February 16, 2016 at 3 pm .   Patient voiced understanding of above  Expected Discharge Date:  02/13/16               Expected Discharge Plan:  Home/Self Care  In-House Referral:     Discharge planning Services  CM Consult, [facility], [facility], Medication Assistance  Post Acute Care Choice:    Choice offered to:  Patient  DME Arranged:    DME Agency:     HH Arranged:    HH Agency:     Status of Service:  Completed, signed off  Medicare Important Message Given:    Date Medicare IM Given:    Medicare IM give by:    Date Additional Medicare IM Given:    Additional Medicare Important Message give by:     If discussed at Long Length of Stay Meetings, dates discussed:    Additional Comments:  Kingsley Plan, RN 02/14/2016, 11:22 AM

## 2016-02-14 NOTE — Discharge Instructions (Signed)
Diverticulitis Diverticulitis is when small pockets that have formed in your colon (large intestine) become infected or swollen. HOME CARE  Follow your doctor's instructions.  Follow a special diet if told by your doctor.  When you feel better, your doctor may tell you to change your diet. You may be told to eat a lot of fiber. Fruits and vegetables are good sources of fiber. Fiber makes it easier to poop (have bowel movements).  Take supplements or probiotics as told by your doctor.  Only take medicines as told by your doctor.  Keep all follow-up visits with your doctor. GET HELP IF:  Your pain does not get better.  You have a hard time eating food.  You are not pooping like normal. GET HELP RIGHT AWAY IF:  Your pain gets worse.  Your problems do not get better.  Your problems suddenly get worse.  You have a fever.  You keep throwing up (vomiting).  You have bloody or black, tarry poop (stool). MAKE SURE YOU:   Understand these instructions.  Will watch your condition.  Will get help right away if you are not doing well or get worse.   This information is not intended to replace advice given to you by your health care provider. Make sure you discuss any questions you have with your health care provider.   Document Released: 05/20/2008 Document Revised: 12/07/2013 Document Reviewed: 10/27/2013 Elsevier Interactive Patient Education 2016 Elsevier Inc.   Soft-Food Meal Plan for the next 2 weeks A soft-food meal plan includes foods that are safe and easy to swallow. This meal plan typically is used:  If you are having trouble chewing or swallowing foods.  As a transition meal plan after only having had liquid meals for a long period. WHAT DO I NEED TO KNOW ABOUT THE SOFT-FOOD MEAL PLAN? A soft-food meal plan includes tender foods that are soft and easy to chew and swallow. In most cases, bite-sized pieces of food are easier to swallow. A bite-sized piece is about   inch or smaller. Foods in this plan do not need to be ground or pureed. Foods that are very hard, crunchy, or sticky should be avoided. Also, breads, cereals, yogurts, and desserts with nuts, seeds, or fruits should be avoided. WHAT FOODS CAN I EAT? Grains Rice and wild rice. Moist bread, dressing, pasta, and noodles. Well-moistened dry or cooked cereals, such as farina (cooked wheat cereal), oatmeal, or grits. Biscuits, breads, muffins, pancakes, and waffles that have been well moistened. Vegetables Shredded lettuce. Cooked, tender vegetables, including potatoes without skins. Vegetable juices. Broths or creamed soups made with vegetables that are not stringy or chewy. Strained tomatoes (without seeds). Fruits Canned or well-cooked fruits. Soft (ripe), peeled fresh fruits, such as peaches, nectarines, kiwi, cantaloupe, honeydew melon, and watermelon (without seeds). Soft berries with small seeds, such as strawberries. Fruit juices (without pulp). Meats and Other Protein Sources Moist, tender, lean beef. Mutton. Lamb. Veal. Chicken. [place]. Liver. Ham. Fish without bones. Eggs. Dairy Milk, milk drinks, and cream. Plain cream cheese and cottage cheese. Plain yogurt. Sweets/Desserts Flavored gelatin desserts. Custard. Plain ice cream, frozen yogurt, sherbet, milk shakes, and malts. Plain cakes and cookies. Plain hard candy.  Other Butter, margarine (without trans fat), and cooking oils. Mayonnaise. Cream sauces. Mild spices, salt, and sugar. Syrup, molasses, honey, and jelly. The items listed above may not be a complete list of recommended foods or beverages. Contact your dietitian for more options. WHAT FOODS ARE NOT RECOMMENDED? Grains Dry bread,  toast, crackers that have not been moistened. Coarse or dry cereals, such as bran, granola, and shredded wheat. Tough or chewy crusty breads, such as [place] bread or baguettes. Vegetables Corn. Raw vegetables except shredded lettuce. Cooked  vegetables that are tough or stringy. Tough, crisp, fried potatoes and potato skins. Fruits Fresh fruits with skins or seeds or both, such as apples, pears, or grapes. Stringy, high-pulp fruits, such as papaya, pineapple, coconut, or mango. Fruit leather, fruit roll-ups, and all dried fruits. Meats and Other Protein Sources Sausages and hot dogs. Meats with gristle. Fish with bones. Nuts, seeds, and chunky peanut or other nut butters. Sweets/Desserts Cakes or cookies that are very dry or chewy.  The items listed above may not be a complete list of foods and beverages to avoid. Contact your dietitian for more information.   This information is not intended to replace advice given to you by your health care provider. Make sure you discuss any questions you have with your health care provider.   Document Released: 03/10/2008 Document Revised: 12/07/2013 Document Reviewed: 10/29/2013 Elsevier Interactive Patient Education 2016 Elsevier Inc.  High-Fiber Diet  Start toward the end of March Fiber, also called dietary fiber, is a type of carbohydrate found in fruits, vegetables, whole grains, and beans. A high-fiber diet can have many health benefits. Your health care provider may recommend a high-fiber diet to help:  Prevent constipation. Fiber can make your bowel movements more regular.  Lower your cholesterol.  Relieve hemorrhoids, uncomplicated diverticulosis, or irritable bowel syndrome.  Prevent overeating as part of a weight-loss plan.  Prevent heart disease, type 2 diabetes, and certain cancers. WHAT IS MY PLAN? The recommended daily intake of fiber includes:  38 grams for men under age 76.  30 grams for men over age 58.  25 grams for women under age 77.  21 grams for women over age 44. You can get the recommended daily intake of dietary fiber by eating a variety of fruits, vegetables, grains, and beans. Your health care provider may also recommend a fiber supplement if it is not  possible to get enough fiber through your diet. WHAT DO I NEED TO KNOW ABOUT A HIGH-FIBER DIET?  Fiber supplements have not been widely studied for their effectiveness, so it is better to get fiber through food sources.  Always check the fiber content on thenutrition facts label of any prepackaged food. Look for foods that contain at least 5 grams of fiber per serving.  Ask your dietitian if you have questions about specific foods that are related to your condition, especially if those foods are not listed in the following section.  Increase your daily fiber consumption gradually. Increasing your intake of dietary fiber too quickly may cause bloating, cramping, or gas.  Drink plenty of water. Water helps you to digest fiber. WHAT FOODS CAN I EAT? Grains Whole-grain breads. Multigrain cereal. Oats and oatmeal. Brown rice. Barley. Bulgur wheat. Millet. Bran muffins. Popcorn. Rye wafer crackers. Vegetables Sweet potatoes. Spinach. Kale. Artichokes. Cabbage. Broccoli. Green peas. Carrots. Squash. Fruits Berries. Pears. Apples. Oranges. Avocados. Prunes and raisins. Dried figs. Meats and Other Protein Sources [place], kidney, pinto, and soy beans. Split peas. Lentils. Nuts and seeds. Dairy Fiber-fortified yogurt. Beverages Fiber-fortified soy milk. Fiber-fortified orange juice. Other Fiber bars. The items listed above may not be a complete list of recommended foods or beverages. Contact your dietitian for more options. WHAT FOODS ARE NOT RECOMMENDED? Grains White bread. Pasta made with refined flour. White rice. Vegetables [place]  potatoes. Canned vegetables. Well-cooked vegetables.  Fruits Fruit juice. Cooked, strained fruit. Meats and Other Protein Sources Fatty cuts of meat. Fried Environmental education officer or fried fish. Dairy Milk. Yogurt. Cream cheese. Sour cream. Beverages Soft drinks. Other Cakes and pastries. Butter and oils. The items listed above may not be a complete list of foods and  beverages to avoid. Contact your dietitian for more information. WHAT ARE SOME TIPS FOR INCLUDING HIGH-FIBER FOODS IN MY DIET?  Eat a wide variety of high-fiber foods.  Make sure that half of all grains consumed each day are whole grains.  Replace breads and cereals made from refined flour or white flour with whole-grain breads and cereals.  Replace white rice with brown rice, bulgur wheat, or millet.  Start the day with a breakfast that is high in fiber, such as a cereal that contains at least 5 grams of fiber per serving.  Use beans in place of meat in soups, salads, or pasta.  Eat high-fiber snacks, such as berries, raw vegetables, nuts, or popcorn.   This information is not intended to replace advice given to you by your health care provider. Make sure you discuss any questions you have with your health care provider.   Document Released: 12/02/2005 Document Revised: 12/23/2014 Document Reviewed: 05/17/2014 Elsevier Interactive Patient Education [employer]

## 2016-02-14 NOTE — Progress Notes (Signed)
  Subjective: He has no pain.  He is doing well with liquids.    Objective: Vital signs in last 24 hours: Temp:  [98.2 F (36.8 C)-98.7 F (37.1 C)] 98.2 F (36.8 C) (03/01 0425) Pulse Rate:  [70-79] 75 (03/01 0425) Resp:  [17-18] 17 (03/01 0425) BP: (118-135)/(58-76) 118/58 mmHg (03/01 0425) SpO2:  [97 %-100 %] 97 % (03/01 0425) Last BM Date: 02/12/16 1300 po RECORDED, NOTHING ELSE RECORDED Afebrile, VSS, CBC is normal  Intake/Output from previous day: 02/28 0701 - 03/01 0700 In: 1303 [P.O.:1300; I.V.:3] Out: -  Intake/Output this shift:    General appearance: alert, cooperative and no distress GI: soft, non-tender; bowel sounds normal; no masses,  no organomegaly  Lab Results:   Recent Labs  02/12/16 0553 02/14/16 0600  WBC 10.4 5.9  HGB 14.1 14.8  HCT 42.3 43.2  PLT 178 236    BMET  Recent Labs  02/12/16 0553 02/13/16 0655  NA 134* 139  K 3.8 3.7  CL 100* 107  CO2 25 26  GLUCOSE 103* 103*  BUN 10 11  CREATININE 0.94 0.86  CALCIUM 8.6* 8.8*   PT/INR No results for input(s): LABPROT, INR in the last 72 hours.   Recent Labs Lab 02/12/16 0553  AST 19  ALT 20  ALKPHOS 76  BILITOT 1.2  PROT 6.7  ALBUMIN 3.2*     Lipase  No results found for: LIPASE   Studies/Results: No results found.  Medications: . antiseptic oral rinse  7 mL Mouth Rinse BID  . ciprofloxacin  500 mg Oral BID  . docusate sodium  100 mg Oral Daily  . enoxaparin (LOVENOX) injection  40 mg Subcutaneous Q24H  . metroNIDAZOLE  500 mg Oral 3 times per day  . sodium chloride flush  3 mL Intravenous Q12H    Assessment/Plan Acute sigmoid diverticulitis with microperforation Recent history of antibiotics for prostatitis with loose stools Antibiotics: Cipro/flagyl day 4 DVT: Lovenox/SCD   Plan:  From our standpoint he can go home. He does not need to follow up with [place], but he should follow up with PCP and should be set up for colonoscopy in the next 2 months.  He should  have 10 days of antibiotics to complete his antibiotic course.    LOS: 3 days    Dale Hines 02/14/2016

## 2016-02-14 NOTE — Progress Notes (Signed)
Discussed discharge summary with patient. Reviewed all medications with patient. Patient received Rx. Patient ready for discharge and waiting on ride.

## 2016-02-14 NOTE — Discharge Summary (Signed)
Physician Discharge Summary  Dale Hines ZOX:[id number] DOB: [birth year] DOA: 02/11/2016  PCP: No PCP Per Patient  Admit date: 02/11/2016 Discharge date: 02/14/2016  Time spent: 20 minutes  Recommendations for Outpatient Follow-up:  1. Follow up with PCP in 2-3 weeks 2. Recommend outpatient GI referral for colonoscopy in 6 weeks   Discharge Diagnoses:  Active Problems:   Diverticulitis of colon with perforation   Acute diverticulitis   Discharge Condition: Improved  Diet recommendation: Soft, advance as tolerated  Filed Weights   02/10/16 2317 02/11/16 0703  Weight: 92.08 kg (203 lb) 88.5 kg (195 lb 1.7 oz)    History of present illness:  Please review dictated H and P from 2/26 for details. Briefly, 60 year old male with no significant past medical history except for history of BPH treated with Flomax and he now presents to the ER with abdominal pain. Patient had loose stools 3 days. Pain is described as lower left abdominal pain, associated symptoms include a low-grade fever. CT scan showed acute diverticulitis of the mid sigmoid colon with focal soft tissue inflammation and wall thickening, associated with microperforation, white count of 14,000.Patient has never had a colonoscopy.  Hospital Course:  Acute diverticulitis with microperforation Successfully changed IV Cipro and Flagyl to oral General surgery had been following and has since cleared pt for discharge as of 3/1 GI pathogen panel negative White blood cell count improving Plan discharge today. Surgery recs for colonoscopy in 6 weeks time  Hyponatremia likely secondary to dehydration, diarrhea, improving   DVT prophylaxsis Lovenox while admitted  Consultations:  General Surgery  Discharge Exam: Filed Vitals:   02/13/16 0632 02/13/16 1320 02/13/16 2200 02/14/16 0425  BP: 134/72 124/76 135/65 118/58  Pulse: 82 70 79 75  Temp: 99.1 F (37.3 C) 98.7 F (37.1 C) 98.7 F (37.1 C) 98.2 F (36.8 C)   TempSrc: Oral Oral Oral   Resp: Height:      Weight:      SpO2: 97% 100% 100% 97%    General: Awake, in nad Cardiovascular: regular, s1, s2 Respiratory: normal resp effort, no wheezing  Discharge Instructions     Medication List    TAKE these medications        amphetamine-dextroamphetamine 15 MG tablet  Commonly known as:  ADDERALL  Take 15 mg by mouth 2 (two) times daily.     ciprofloxacin 500 MG tablet  Commonly known as:  CIPRO  Take 1 tablet (500 mg total) by mouth 2 (two) times daily.     docusate sodium 100 MG capsule  Commonly known as:  COLACE  Take 1 capsule (100 mg total) by mouth 2 (two) times daily.     metroNIDAZOLE 500 MG tablet  Commonly known as:  FLAGYL  Take 1 tablet (500 mg total) by mouth every 8 (eight) hours.     multivitamin with minerals Tabs tablet  Take 1 tablet by mouth daily.     SUPER B COMPLEX PO  Take 1 tablet by mouth daily with lunch.     traZODone 100 MG tablet  Commonly known as:  DESYREL  Take 100 mg by mouth at bedtime.       No Known Allergies Follow-up Information    Follow up with follow up with PCP in 2-3 weeks.   Why:  Hospital follow up       The results of significant diagnostics from this hospitalization (including imaging, microbiology, ancillary and laboratory) are listed below for reference.  Significant Diagnostic Studies: Ct Abdomen Pelvis W Contrast  02/11/2016  CLINICAL DATA:  Acute onset of diarrhea. Generalized lower abdominal pain and headache. Initial encounter. EXAM: CT ABDOMEN AND PELVIS WITH CONTRAST TECHNIQUE: Multidetector CT imaging of the abdomen and pelvis was performed using the standard protocol following bolus administration of intravenous contrast. CONTRAST:  OMNIPAQUE IOHEXOL 300 MG/ML  SOLN COMPARISON:  None. FINDINGS: The visualized lung bases are clear. The liver and spleen are unremarkable in appearance. The gallbladder is within normal limits. The pancreas and  adrenal glands are unremarkable. Bilateral renal cysts are noted, measuring up to 4.0 cm in size. There is no evidence of hydronephrosis. No renal or ureteral stones are seen. No perinephric stranding is appreciated. No free fluid is identified. The small bowel is unremarkable in appearance. The stomach is within normal limits. No acute vascular abnormalities are seen. Minimal calcification is noted along the abdominal aorta and its branches. The appendix is normal in caliber, without evidence of appendicitis. Focal soft tissue inflammation and wall thickening are noted at the mid sigmoid colon, with a small amount of associated free fluid, compatible with acute diverticulitis. Several small foci of free air are seen tracking proximally along the mesentery, compatible with microperforation. The bladder is mildly distended and grossly unremarkable. The prostate remains normal in size. No inguinal lymphadenopathy is seen. No acute osseous abnormalities are identified. Mild disc space narrowing is noted at L4-L5. IMPRESSION: 1. Acute diverticulitis at the mid sigmoid colon, with focal soft tissue inflammation and wall thickening, and small amount of associated free fluid. Several small foci of free air tracking proximally along the mesentery, compatible with microperforation. 2. Bilateral renal cysts noted. These results were called by telephone at the time of interpretation on 02/11/2016 at 5:09 am to Dr. Paula Libra, who verbally acknowledged these results. Electronically Signed   By: Roanna Raider M.D.   On: 02/11/2016 05:10    Microbiology: Recent Results (from the past 240 hour(s))  Urine culture     Status: None   Collection Time: 02/11/16  8:20 AM  Result Value Ref Range Status   Specimen Description URINE, RANDOM  Final   Special Requests NONE  Final   Culture NO GROWTH 1 DAY  Final   Report Status 02/12/2016 FINAL  Final  Gastrointestinal Panel by PCR , Stool     Status: None   Collection Time:  02/12/16 10:02 AM  Result Value Ref Range Status   Campylobacter species NOT DETECTED NOT DETECTED Final   Plesimonas shigelloides NOT DETECTED NOT DETECTED Final   Salmonella species NOT DETECTED NOT DETECTED Final   Yersinia enterocolitica NOT DETECTED NOT DETECTED Final   Vibrio species NOT DETECTED NOT DETECTED Final   Vibrio cholerae NOT DETECTED NOT DETECTED Final   Enteroaggregative E coli (EAEC) NOT DETECTED NOT DETECTED Final   Enteropathogenic E coli (EPEC) NOT DETECTED NOT DETECTED Final   Enterotoxigenic E coli (ETEC) NOT DETECTED NOT DETECTED Final   Shiga like toxin producing E coli (STEC) NOT DETECTED NOT DETECTED Final   E. coli O157 NOT DETECTED NOT DETECTED Final   Shigella/Enteroinvasive E coli (EIEC) NOT DETECTED NOT DETECTED Final   Cryptosporidium NOT DETECTED NOT DETECTED Final   Cyclospora cayetanensis NOT DETECTED NOT DETECTED Final   Entamoeba histolytica NOT DETECTED NOT DETECTED Final   Giardia lamblia NOT DETECTED NOT DETECTED Final   Adenovirus F40/41 NOT DETECTED NOT DETECTED Final   Astrovirus NOT DETECTED NOT DETECTED Final   Norovirus GI/GII NOT DETECTED  NOT DETECTED Final   Rotavirus A NOT DETECTED NOT DETECTED Final   Sapovirus (I, II, IV, and V) NOT DETECTED NOT DETECTED Final  C difficile quick scan w PCR reflex     Status: None   Collection Time: 02/12/16 10:02 AM  Result Value Ref Range Status   C Diff antigen NEGATIVE NEGATIVE Final   C Diff toxin NEGATIVE NEGATIVE Final   C Diff interpretation Negative for toxigenic C. difficile  Final     Labs: Basic Metabolic Panel:  Recent Labs Lab 02/11/16 0120 02/11/16 0823 02/12/16 0553 02/13/16 0655 02/14/16 0600  NA 132*  --  134* 139 139  K 3.8  --  3.8 3.7 3.7  CL 101  --  100* 107 105  CO2 23  --  25 26 25   GLUCOSE 133*  --  103* 103* 106*  BUN 16  --  10 11 15   CREATININE 0.76  --  0.94 0.86 0.90  CALCIUM 8.5*  --  8.6* 8.8* 9.2  MG  --  1.9  --   --   --    Liver Function  Tests:  Recent Labs Lab 02/12/16 0553 02/14/16 0600  AST 19 29  ALT 20 28  ALKPHOS 76 73  BILITOT 1.2 0.6  PROT 6.7 6.6  ALBUMIN 3.2* 3.3*   No results for input(s): LIPASE, AMYLASE in the last 168 hours. No results for input(s): AMMONIA in the last 168 hours. CBC:  Recent Labs Lab 02/11/16 0120 02/12/16 0553 02/14/16 0600  WBC 14.7* 10.4 5.9  NEUTROABS 11.7*  --   --   HGB 15.0 14.1 14.8  HCT 43.3 42.3 43.2  MCV 89.8 89.8 88.2  PLT 182 178 236   Cardiac Enzymes: No results for input(s): CKTOTAL, CKMB, CKMBINDEX, TROPONINI in the last 168 hours. BNP: BNP (last 3 results) No results for input(s): BNP in the last 8760 hours.  ProBNP (last 3 results) No results for input(s): PROBNP in the last 8760 hours.  CBG: No results for input(s): GLUCAP in the last 168 hours.   Signed:  Khaliyah Northrop K  [facility] 02/14/2016, 10:58 AM

## 2016-02-16 ENCOUNTER — Ambulatory Visit: Payer: Self-pay | Attending: Physician Assistant | Admitting: Physician Assistant

## 2016-02-16 ENCOUNTER — Ambulatory Visit (HOSPITAL_BASED_OUTPATIENT_CLINIC_OR_DEPARTMENT_OTHER): Payer: Self-pay | Admitting: Clinical

## 2016-02-16 VITALS — BP 137/86 | HR 89 | Temp 98.1°F | Resp 16 | Ht 66.0 in | Wt 198.0 lb

## 2016-02-16 DIAGNOSIS — K5792 Diverticulitis of intestine, part unspecified, without perforation or abscess without bleeding: Secondary | ICD-10-CM

## 2016-02-16 DIAGNOSIS — Z79899 Other long term (current) drug therapy: Secondary | ICD-10-CM | POA: Insufficient documentation

## 2016-02-16 DIAGNOSIS — K5720 Diverticulitis of large intestine with perforation and abscess without bleeding: Secondary | ICD-10-CM | POA: Insufficient documentation

## 2016-02-16 DIAGNOSIS — R51 Headache: Secondary | ICD-10-CM | POA: Insufficient documentation

## 2016-02-16 DIAGNOSIS — Z7984 Long term (current) use of oral hypoglycemic drugs: Secondary | ICD-10-CM | POA: Insufficient documentation

## 2016-02-16 DIAGNOSIS — E119 Type 2 diabetes mellitus without complications: Secondary | ICD-10-CM | POA: Insufficient documentation

## 2016-02-16 DIAGNOSIS — Z719 Counseling, unspecified: Secondary | ICD-10-CM

## 2016-02-16 DIAGNOSIS — Z131 Encounter for screening for diabetes mellitus: Secondary | ICD-10-CM

## 2016-02-16 LAB — POCT GLYCOSYLATED HEMOGLOBIN (HGB A1C): HEMOGLOBIN A1C: 6.6

## 2016-02-16 MED ORDER — METFORMIN HCL 1000 MG PO TABS: 1000.0000 mg | ORAL_TABLET | Freq: Every day | ORAL | Status: AC

## 2016-02-16 NOTE — Progress Notes (Signed)
Patient ID: Dale EldersMohammad Mallon, male   DOB: 05/23/1956, 60 y.o.   MRN: [id number]   Dale EldersMohammad Ratterman, is a 60 y.o. male  OZH:[id number]  GEX:[id number]  DOB - 05/16/1956  Chief Complaint  Patient presents with  . Hospitalization Follow-up  . Diverticulitis        Subjective:   Dale Hines is a 60 y.o. male here today for a follow up visit from having been hospitalized for diverticulitis with microperforation.  He did not have to have a surgical procedure for the perforation.  He has not had any fever or abdominal pain since discharge.  He denies N/V/D Patient has No headache, No chest pain, No abdominal pain - No Nausea, No new weakness tingling or numbness, No Cough - SOB.  He has several more days on his antibiotic course from the hospital and is tolerating these without any problems.  He would like to establish with us as his PCP.  He denies polyuria/polydipsia/vison changes related to elevated blood sugars.  His sister is diabetic.   ALLERGIES: No Known Allergies  PAST MEDICAL HISTORY: Past Medical History  Diagnosis Date  . Headache     MEDICATIONS AT HOME: Prior to Admission medications   Medication Sig Start Date End Date Taking? Authorizing Provider  amphetamine-dextroamphetamine (ADDERALL) 15 MG tablet Take 15 mg by mouth 2 (two) times daily.    Yes Historical Provider, MD  B Complex-C (SUPER B COMPLEX PO) Take 1 tablet by mouth daily with lunch.   Yes Historical Provider, MD  ciprofloxacin (CIPRO) 500 MG tablet Take 1 tablet (500 mg total) by mouth 2 (two) times daily. 02/14/16  Yes Jerald KiefStephen K Chiu, MD  docusate sodium (COLACE) 100 MG capsule Take 1 capsule (100 mg total) by mouth 2 (two) times daily. 02/14/16  Yes Jerald KiefStephen K Chiu, MD  metroNIDAZOLE (FLAGYL) 500 MG tablet Take 1 tablet (500 mg total) by mouth every 8 (eight) hours. 02/14/16  Yes Jerald KiefStephen K Chiu, MD  Multiple Vitamin (MULTIVITAMIN WITH MINERALS) TABS tablet Take 1 tablet by mouth daily.   Yes Historical  Provider, MD  traZODone (DESYREL) 100 MG tablet Take 100 mg by mouth at bedtime.   Yes Historical Provider, MD  metFORMIN (GLUCOPHAGE) 1000 MG tablet Take 1 tablet (1,000 mg total) by mouth daily with breakfast. 02/16/16   Anders SimmondsAngela M Kenai Fluegel, PA-C   ROS is negative.   Objective:   Filed Vitals:   02/16/16 1537  BP: 137/86  Pulse: 89  Temp: 98.1 F (36.7 C)  TempSrc: Oral  Resp: 16  Height: 5\' 6"  (1.676 m)  Weight: 198 lb (89.812 kg)  SpO2: 97%    Exam General appearance : Awake, alert, not in any distress. Speech Clear. Not toxic looking HEENT: Atraumatic and Normocephalic, pupils equally reactive to light and accomodation Neck: supple, no JVD. No cervical lymphadenopathy.  Chest:Good air entry bilaterally, no added sounds  CVS: S1 S2 regular, no murmurs.  Abdomen: Bowel sounds present, Non tender and not distended with no gaurding, rigidity or rebound. Extremities: B/L Lower Ext shows no edema, both legs are warm to touch Neurology: Awake alert, and oriented X 3, CN II-XII intact, Non focal Skin:No Rash  Data Review Lab Results  Component Value Date   HGBA1C 6.6 02/16/2016     Assessment & Plan  1. Type 2 diabetes mellitus without complication, without long-term current use of insulin (HCC) Newly diagnosed.  Counseled by Irven BaltimoreJamie McManness, licensed social worker about diabetic nutrition.  Start metformin and will continue to titrate  dose in 2 weeks if he is tolerating the  dose without any problem.  Check fasting lipids on return visit.  2. Acute diverticulitis Resolving-finish antibiotics.   3. Diverticulitis of colon with perforation Resolving-finish antibiotics-reviewed diverticulitis care; watch for fever/return of abdominal pain, etc to ER Patient have been counseled extensively about nutrition and exercise   Screening for diabetes mellitus - HgB A1c=6.6  Return in about 2 weeks (around 03/01/2016) for establish care; check fasting lipids; recheck newly  diagnosed diabetes.  The patient was given clear instructions to go to ER or return to medical center if symptoms don't improve, worsen or new problems develop. The patient verbalized understanding. The patient was told to call to get lab results if they haven't heard anything in the next week.   Georgian Co, PA-C [facility] [place], [place] [phone]   02/16/2016, 4:22 PM

## 2016-02-16 NOTE — Patient Instructions (Signed)

## 2016-02-16 NOTE — Progress Notes (Signed)
Patient's here for hospital f/up diverticulitis. Patient states he feels good. Denies any pain today.  Patient is concern with his cholesterol level and requesting lab results from hospital stay.  Patient would like to est. care with PCP.  Patient agreed to A1c screening. Patient has had the flu shot

## 2016-02-16 NOTE — Progress Notes (Signed)
ASSESSMENT: Pt currently needs health education/counseling for new diabetes diagnosis, needs to f/u with PCP and would benefit from motivational interviewing to determine stage of behavior change. Stage of Change: determination  PLAN: 1. F/U with behavioral health consultant in two weeks 2. Psychiatric Medications: none 3. Behavioral recommendation(s):   -Switch white bread to whole grain bread -Replace sweet tea in evenings with lemon/garlic/ginger -Consider keeping food log for 2 weeks to bring to next PCP visit -Consider reading educational material regarding healthy eating and diabetes SUBJECTIVE: Pt. referred by Georgian CoAngela McClung, PA-C for : health education/counseling Pt. reports the following symptoms/concerns: Pt states that diabetes is a new diagnosis for him, and that he is open to making changes to eat healthier and take meds to keep diabetes under control; he says the changes he is most likely to make are to switch white bread to whole grain bread, and to prepare his lemon,garlic and ginger mixture to replace his usual sweet tea at night.  Duration of problem: today  Severity: mild  OBJECTIVE: Orientation & Cognition: Oriented x3. Thought processes normal and appropriate to situation. Mood: appropriate Affect: appropriate Appearance: appropriate Risk of harm to self or others: no known risk of harm to self or others Substance use: alcohol Assessments administered: none  Diagnosis: Health education/counseling CPT Code: Z71.9 -------------------------------------------- Other(s) present in the room: none  Time spent with patient in exam room: 15 minutes, 3:30-3:45pm  Depression screen PHQ 2/9 02/16/2016  Decreased Interest 0  Down, Depressed, Hopeless 0  PHQ - 2 Score 0

## 2016-02-19 MED FILL — ?METFORMIN HCL 1,000 MG TAB: 1000 | 30 days supply | Qty: 30 | Fill #0

## 2019-12-04 ENCOUNTER — Emergency Department (HOSPITAL_COMMUNITY): Payer: Medicare Other

## 2019-12-04 ENCOUNTER — Emergency Department (HOSPITAL_COMMUNITY)
Admission: EM | Admit: 2019-12-04 | Discharge: 2019-12-04 | Disposition: A | Discharge status: Home / Self Care | Payer: Medicare Other | Attending: Emergency Medicine | Admitting: Emergency Medicine

## 2019-12-04 ENCOUNTER — Other Ambulatory Visit: Payer: Self-pay

## 2019-12-04 DIAGNOSIS — R062 Wheezing: Secondary | ICD-10-CM | POA: Diagnosis present

## 2019-12-04 DIAGNOSIS — R519 Headache, unspecified: Secondary | ICD-10-CM | POA: Diagnosis not present

## 2019-12-04 DIAGNOSIS — R000 Tachycardia, unspecified: Secondary | ICD-10-CM | POA: Diagnosis not present

## 2019-12-04 DIAGNOSIS — I10 Essential (primary) hypertension: Secondary | ICD-10-CM | POA: Diagnosis not present

## 2019-12-04 DIAGNOSIS — Z7984 Long term (current) use of oral hypoglycemic drugs: Secondary | ICD-10-CM | POA: Diagnosis not present

## 2019-12-04 DIAGNOSIS — U071 COVID-19: Secondary | ICD-10-CM | POA: Diagnosis not present

## 2019-12-04 DIAGNOSIS — E119 Type 2 diabetes mellitus without complications: Secondary | ICD-10-CM | POA: Diagnosis not present

## 2019-12-04 DIAGNOSIS — Z79899 Other long term (current) drug therapy: Secondary | ICD-10-CM | POA: Diagnosis not present

## 2019-12-04 LAB — C-REACTIVE PROTEIN: CRP: 1 mg/dL — ABNORMAL HIGH (ref <1.0)

## 2019-12-04 LAB — CBC WITH DIFFERENTIAL/PLATELET
Abs Immature Granulocytes: 0.01 10*3/uL (ref 0.00–0.07)
Basophils Absolute: 0 10*3/uL (ref 0.0–0.1)
Basophils Relative: 0 %
Eosinophils Absolute: 0 10*3/uL (ref 0.0–0.5)
Eosinophils Relative: 0 %
HCT: 48.6 % (ref 39.0–52.0)
Hemoglobin: 16.5 g/dL (ref 13.0–17.0)
Immature Granulocytes: 0 %
Lymphocytes Relative: 23 %
Lymphs Abs: 0.9 10*3/uL (ref 0.7–4.0)
MCH: 30.6 pg (ref 26.0–34.0)
MCHC: 34 g/dL (ref 30.0–36.0)
MCV: 90 fL (ref 80.0–100.0)
Monocytes Absolute: 0.4 10*3/uL (ref 0.1–1.0)
Monocytes Relative: 11 %
Neutro Abs: 2.5 10*3/uL (ref 1.7–7.7)
Neutrophils Relative %: 66 %
Platelets: 156 10*3/uL (ref 150–400)
RBC: 5.4 MIL/uL (ref 4.22–5.81)
RDW: 13.1 % (ref 11.5–15.5)
WBC: 3.8 10*3/uL — ABNORMAL LOW (ref 4.0–10.5)
nRBC: 0 % (ref 0.0–0.2)

## 2019-12-04 LAB — COMPREHENSIVE METABOLIC PANEL
ALT: 35 U/L (ref 0–44)
AST: 36 U/L (ref 15–41)
Albumin: 3.8 g/dL (ref 3.5–5.0)
Alkaline Phosphatase: 84 U/L (ref 38–126)
Anion gap: 11 (ref 5–15)
BUN: 17 mg/dL (ref 8–23)
CO2: 21 mmol/L — ABNORMAL LOW (ref 22–32)
Calcium: 8.8 mg/dL — ABNORMAL LOW (ref 8.9–10.3)
Chloride: 103 mmol/L (ref 98–111)
Creatinine, Ser: 0.8 mg/dL (ref 0.61–1.24)
GFR calc Af Amer: >60 mL/min (ref >60)
GFR calc non Af Amer: >60 mL/min (ref >60)
Glucose, Bld: 90 mg/dL (ref 70–99)
Potassium: 3.9 mmol/L (ref 3.5–5.1)
Sodium: 135 mmol/L (ref 135–145)
Total Bilirubin: 0.5 mg/dL (ref 0.3–1.2)
Total Protein: 7.3 g/dL (ref 6.5–8.1)

## 2019-12-04 LAB — FERRITIN: Ferritin: 273 ng/mL (ref 24–336)

## 2019-12-04 LAB — D-DIMER, QUANTITATIVE: D-Dimer, Quant: 0.46 ug/mL-FEU (ref 0.00–0.50)

## 2019-12-04 LAB — FIBRINOGEN: Fibrinogen: 539 mg/dL — ABNORMAL HIGH (ref 210–475)

## 2019-12-04 LAB — TRIGLYCERIDES: Triglycerides: 110 mg/dL (ref <150)

## 2019-12-04 LAB — PROCALCITONIN: Procalcitonin: <0.1 ng/mL

## 2019-12-04 LAB — LACTIC ACID, PLASMA: Lactic Acid, Venous: 1.2 mmol/L (ref 0.5–1.9)

## 2019-12-04 LAB — POC SARS CORONAVIRUS 2 AG -  ED: SARS Coronavirus 2 Ag: POSITIVE — AB

## 2019-12-04 LAB — LACTATE DEHYDROGENASE: LDH: 218 U/L — ABNORMAL HIGH (ref 98–192)

## 2019-12-04 MED ORDER — SODIUM CHLORIDE 0.9 % IV BOLUS
1000.0000 mL | Freq: Once | INTRAVENOUS | Status: AC
  Administered 2019-12-04: 1000 mL via INTRAVENOUS

## 2019-12-04 MED ORDER — ACETAMINOPHEN 500 MG PO TABS
1000.0000 mg | ORAL_TABLET | Freq: Once | ORAL | Status: AC
  Administered 2019-12-04: 15:00:00 1000 mg via ORAL
  Filled 2019-12-04: qty 2

## 2019-12-04 NOTE — Discharge Instructions (Addendum)
Call your doctor tomorrow to ask about setting up outpatient monoclonal antibody infusions to treat your Covid infection   Person Under Monitoring Name: Dale Hines  Location: [address] [place] [address]   Infection Prevention Recommendations for Individuals Confirmed to have, or Being Evaluated for, 2019 Novel Coronavirus (COVID-19) Infection Who Receive Care at Home  Individuals who are confirmed to have, or are being evaluated for, COVID-19 should follow the prevention steps below until a healthcare provider or local or state health department says they can return to normal activities.  Stay home except to get medical care You should restrict activities outside your home, except for getting medical care. Do not go to work, school, or public areas, and do not use public transportation or taxis.  Call ahead before visiting your doctor Before your medical appointment, call the healthcare provider and tell them that you have, or are being evaluated for, COVID-19 infection. This will help the healthcare provider's office take steps to keep other people from getting infected. Ask your healthcare provider to call the local or state health department.  Monitor your symptoms Seek prompt medical attention if your illness is worsening (e.g., difficulty breathing). Before going to your medical appointment, call the healthcare provider and tell them that you have, or are being evaluated for, COVID-19 infection. Ask your healthcare provider to call the local or state health department.  Wear a facemask You should wear a facemask that covers your nose and mouth when you are in the same room with other people and when you visit a healthcare provider. People who live with or visit you should also wear a facemask while they are in the same room with you.  Separate yourself from other people in your home As much as possible, you should stay in a different room from  other people in your home. Also, you should use a separate bathroom, if available.  Avoid sharing household items You should not share dishes, drinking glasses, cups, eating utensils, towels, bedding, or other items with other people in your home. After using these items, you should wash them thoroughly with soap and water.  Cover your coughs and sneezes Cover your mouth and nose with a tissue when you cough or sneeze, or you can cough or sneeze into your sleeve. Throw used tissues in a lined trash can, and immediately wash your hands with soap and water for at least 20 seconds or use an alcohol-based hand rub.  Wash your [employer] your hands often and thoroughly with soap and water for at least 20 seconds. You can use an alcohol-based hand sanitizer if soap and water are not available and if your hands are not visibly dirty. Avoid touching your eyes, nose, and mouth with unwashed hands.   Prevention Steps for Caregivers and Household Members of Individuals Confirmed to have, or Being Evaluated for, COVID-19 Infection Being Cared for in the Home  If you live with, or provide care at home for, a person confirmed to have, or being evaluated for, COVID-19 infection please follow these guidelines to prevent infection:  Follow healthcare provider's instructions Make sure that you understand and can help the patient follow any healthcare provider instructions for all care.  Provide for the patient's basic needs You should help the patient with basic needs in the home and provide support for getting groceries, prescriptions, and other personal needs.  Monitor the patient's symptoms If they are getting sicker, call his or her medical provider and  tell them that the patient has, or is being evaluated for, COVID-19 infection. This will help the healthcare provider's office take steps to keep other people from getting infected. Ask the healthcare provider to call the local or state health  department.  Limit the number of people who have contact with the patient If possible, have only one caregiver for the patient. Other household members should stay in another home or place of residence. If this is not possible, they should stay in another room, or be separated from the patient as much as possible. Use a separate bathroom, if available. Restrict visitors who do not have an essential need to be in the home.  Keep older adults, very young children, and other sick people away from the patient Keep older adults, very young children, and those who have compromised immune systems or chronic health conditions away from the patient. This includes people with chronic heart, lung, or kidney conditions, diabetes, and cancer.  Ensure good ventilation Make sure that shared spaces in the home have good air flow, such as from an air conditioner or an opened window, weather permitting.  Wash your hands often Wash your hands often and thoroughly with soap and water for at least 20 seconds. You can use an alcohol based hand sanitizer if soap and water are not available and if your hands are not visibly dirty. Avoid touching your eyes, nose, and mouth with unwashed hands. Use disposable paper towels to dry your hands. If not available, use dedicated cloth towels and replace them when they become wet.  Wear a facemask and gloves Wear a disposable facemask at all times in the room and gloves when you touch or have contact with the patient's blood, body fluids, and/or secretions or excretions, such as sweat, saliva, sputum, nasal mucus, vomit, urine, or feces.  Ensure the mask fits over your nose and mouth tightly, and do not touch it during use. Throw out disposable facemasks and gloves after using them. Do not reuse. Wash your hands immediately after removing your facemask and gloves. If your personal clothing becomes contaminated, carefully remove clothing and launder. Wash your hands after  handling contaminated clothing. Place all used disposable facemasks, gloves, and other waste in a lined container before disposing them with other household waste. Remove gloves and wash your hands immediately after handling these items.  Do not share dishes, glasses, or other household items with the patient Avoid sharing household items. You should not share dishes, drinking glasses, cups, eating utensils, towels, bedding, or other items with a patient who is confirmed to have, or being evaluated for, COVID-19 infection. After the person uses these items, you should wash them thoroughly with soap and water.  Wash laundry thoroughly Immediately remove and wash clothes or bedding that have blood, body fluids, and/or secretions or excretions, such as sweat, saliva, sputum, nasal mucus, vomit, urine, or feces, on them. Wear gloves when handling laundry from the patient. Read and follow directions on labels of laundry or clothing items and detergent. In general, wash and dry with the warmest temperatures recommended on the label.  Clean all areas the individual has used often Clean all touchable surfaces, such as counters, tabletops, doorknobs, bathroom fixtures, toilets, phones, keyboards, tablets, and bedside tables, every day. Also, clean any surfaces that may have blood, body fluids, and/or secretions or excretions on them. Wear gloves when cleaning surfaces the patient has come in contact with. Use a diluted bleach solution (e.g., dilute bleach with 1 part bleach  and 10 parts water) or a household disinfectant with a label that says EPA-registered for coronaviruses. To make a bleach solution at home, add 1 tablespoon of bleach to 1 quart (4 cups) of water. For a larger supply, add  cup of bleach to 1 gallon (16 cups) of water. Read labels of cleaning products and follow recommendations provided on product labels. Labels contain instructions for safe and effective use of the cleaning product  including precautions you should take when applying the product, such as wearing gloves or eye protection and making sure you have good ventilation during use of the product. Remove gloves and wash hands immediately after cleaning.  Monitor yourself for signs and symptoms of illness Caregivers and household members are considered close contacts, should monitor their health, and will be asked to limit movement outside of the home to the extent possible. Follow the monitoring steps for close contacts listed on the symptom monitoring form.   ? If you have additional questions, contact your local health department or call the epidemiologist on call at [phone] (available 24/7). ? This guidance is subject to change. For the most up-to-date guidance from [facility], please refer to their website: TripMetro.huhttps://www.cdc.gov/coronavirus/2019-ncov/hcp/guidance-prevent-spread.html

## 2019-12-04 NOTE — ED Notes (Signed)
Pt ambulated in room. Oxygen saturations ranging from 95%-96% on room air.

## 2019-12-04 NOTE — ED Provider Notes (Signed)
Patient signed to me by Dr. Lanny Cramp pending work-up.  Patient's x-ray shows possible early pneumonia.  Is likely from patient being Covid positive.  He denies being short of breath here.  States he feels well to go home.  Return precautions given.   Lacretia Leigh, MD 12/04/19 1650

## 2019-12-04 NOTE — ED Triage Notes (Addendum)
Patient arrives via POV due to having worsening COVID symptoms at at home. Per patient, he tested positive for covid 1 week ago at [facility] and he has been monitoring his symptoms at home since that time. He checked his heart rate and oxygen sats today and his oxygen saturations dropped to 91% on room air. his heart rate was around 110. Also has pain in chest when he coughs.  He became concerned and came to the ED for help.

## 2019-12-04 NOTE — ED Notes (Signed)
Patient verbalizes understanding of discharge instructions. Opportunity for questioning and answers were provided. Armband removed by staff, pt discharged from ED with wife.   

## 2019-12-04 NOTE — ED Provider Notes (Signed)
[facility] EMERGENCY DEPARTMENT Provider Note   CSN: [id number] Arrival date & time: 12/04/19  1348     History Chief Complaint  Patient presents with  . Covid/SOB    Dale Hines is a 63 y.o. male.  Patient is a 63 year old male with past medical history of type II diabetes, diverticulitis.  He presents today for evaluation of elevated heart rate and shortness of breath.  Patient was diagnosed with COVID-19 1 week ago at a local pharmacy.  He has been staying home and monitoring his heart rate and oxygen saturations since then.  This morning he got a reading of 110 on his heart rate and oxygen saturation of 91% and presents today for evaluation.  He denies nausea, vomiting, or diarrhea.  Patient does have a borderline fever of 99.8 upon presentation.  He denies fever at home.  The history is provided by the patient.       Past Medical History:  Diagnosis Date  . Headache     Patient Active Problem List   Diagnosis Date Noted  . Diverticulitis of colon with perforation 02/11/2016  . Acute diverticulitis 02/11/2016    No past surgical history on file.     Family History  Family history unknown: Yes    Social History   Tobacco Use  . Smoking status: Never Smoker  Substance Use Topics  . Alcohol use: Yes    Comment: very little  . Drug use: No    Home Medications Prior to Admission medications   Medication Sig Start Date End Date Taking? Authorizing Provider  amphetamine-dextroamphetamine (ADDERALL) 15 MG tablet Take 15 mg by mouth 2 (two) times daily.     [provider]  B Complex-C (SUPER B COMPLEX PO) Take 1 tablet by mouth daily with lunch.    [provider]  ciprofloxacin (CIPRO) 500 MG tablet Take 1 tablet (500 mg total) by mouth 2 (two) times daily. 02/14/16   Donne Hazel, MD  docusate sodium (COLACE) 100 MG capsule Take 1 capsule (100 mg total) by mouth 2 (two) times daily. 02/14/16   Donne Hazel, MD    metFORMIN (GLUCOPHAGE) 1000 MG tablet Take 1 tablet (1,000 mg total) by mouth daily with breakfast. 02/16/16   Argentina Donovan, PA-C  metroNIDAZOLE (FLAGYL) 500 MG tablet Take 1 tablet (500 mg total) by mouth every 8 (eight) hours. 02/14/16   Donne Hazel, MD  Multiple Vitamin (MULTIVITAMIN WITH MINERALS) TABS tablet Take 1 tablet by mouth daily.    [provider]  traZODone (DESYREL) 100 MG tablet Take 100 mg by mouth at bedtime.    [provider]    Allergies    Patient has no known allergies.  Review of Systems   Review of Systems  All other systems reviewed and are negative.   Physical Exam Updated Vital Signs BP 140/87 (BP Location: Right Arm)   Pulse (!) 101   Temp 99.8 F (37.7 C) (Oral)   Resp (!) 25   Ht 5\' 6"  (1.676 m)   Wt 83.9 kg   SpO2 97%   BMI 29.86 kg/m   Physical Exam Vitals and nursing note reviewed.  Constitutional:      General: He is not in acute distress.    Appearance: He is well-developed. He is not diaphoretic.  HENT:     Head: Normocephalic and atraumatic.  Cardiovascular:     Rate and Rhythm: Regular rhythm. Tachycardia present.     Heart sounds:  No murmur. No friction rub.  Pulmonary:     Effort: Pulmonary effort is normal. No respiratory distress.     Breath sounds: Normal breath sounds. No wheezing or rales.  Abdominal:     General: Bowel sounds are normal. There is no distension.     Palpations: Abdomen is soft.     Tenderness: There is no abdominal tenderness.  Musculoskeletal:        General: No swelling or tenderness. Normal range of motion.     Cervical back: Normal range of motion and neck supple.     Right lower leg: No edema.     Left lower leg: No edema.     Comments: Denna Haggard' sign is absent bilaterally.  Skin:    General: Skin is warm and dry.  Neurological:     Mental Status: He is alert and oriented to person, place, and time.     Coordination: Coordination normal.     ED Results / Procedures /  Treatments   Labs (all labs ordered are listed, but only abnormal results are displayed) Labs Reviewed - No data to display  EKG None  Radiology No results found.  Procedures Procedures (including critical care time)  Medications Ordered in ED Medications - No data to display  ED Course  I have reviewed the triage vital signs and the nursing notes.  Pertinent labs & imaging results that were available during my care of the patient were reviewed by me and considered in my medical decision making (see chart for details).  Patient with history of HTN and DM presenting with complaints of elevated HR and low oxygen saturations.  He was diagnosed with Covid 19 one week ago.  Patient's x-ray shows developing infiltrates in the left base.  Labs pending.  Patient to be hydrated with NS awaiting labs.  Care to be signed out at shift change to Dr. Freida Busman.  He will obtain the result of the laboratory studies and determine the final disposition.    MDM Rules/Calculators/A&P   Final Clinical Impression(s) / ED Diagnoses Final diagnoses:  None    Rx / DC Orders ED Discharge Orders    None       Geoffery Lyons, MD 12/04/19 [phone]

## 2019-12-09 LAB — CULTURE, BLOOD (ROUTINE X 2)
Culture: NO GROWTH
Culture: NO GROWTH
Special Requests: ADEQUATE
Special Requests: ADEQUATE

## 2020-07-30 IMAGING — DX DG CHEST 1V PORT
1 series · 2 of 2 positions shown · non-contrast
Comparison: None.

CLINICAL DATA: [id number] positive.  Shortness of breath.

EXAM:
PORTABLE CHEST 1 VIEW

[Series 1: chest · 0.14mm/px · 2 of 2 slices shown]
[im 1/2]
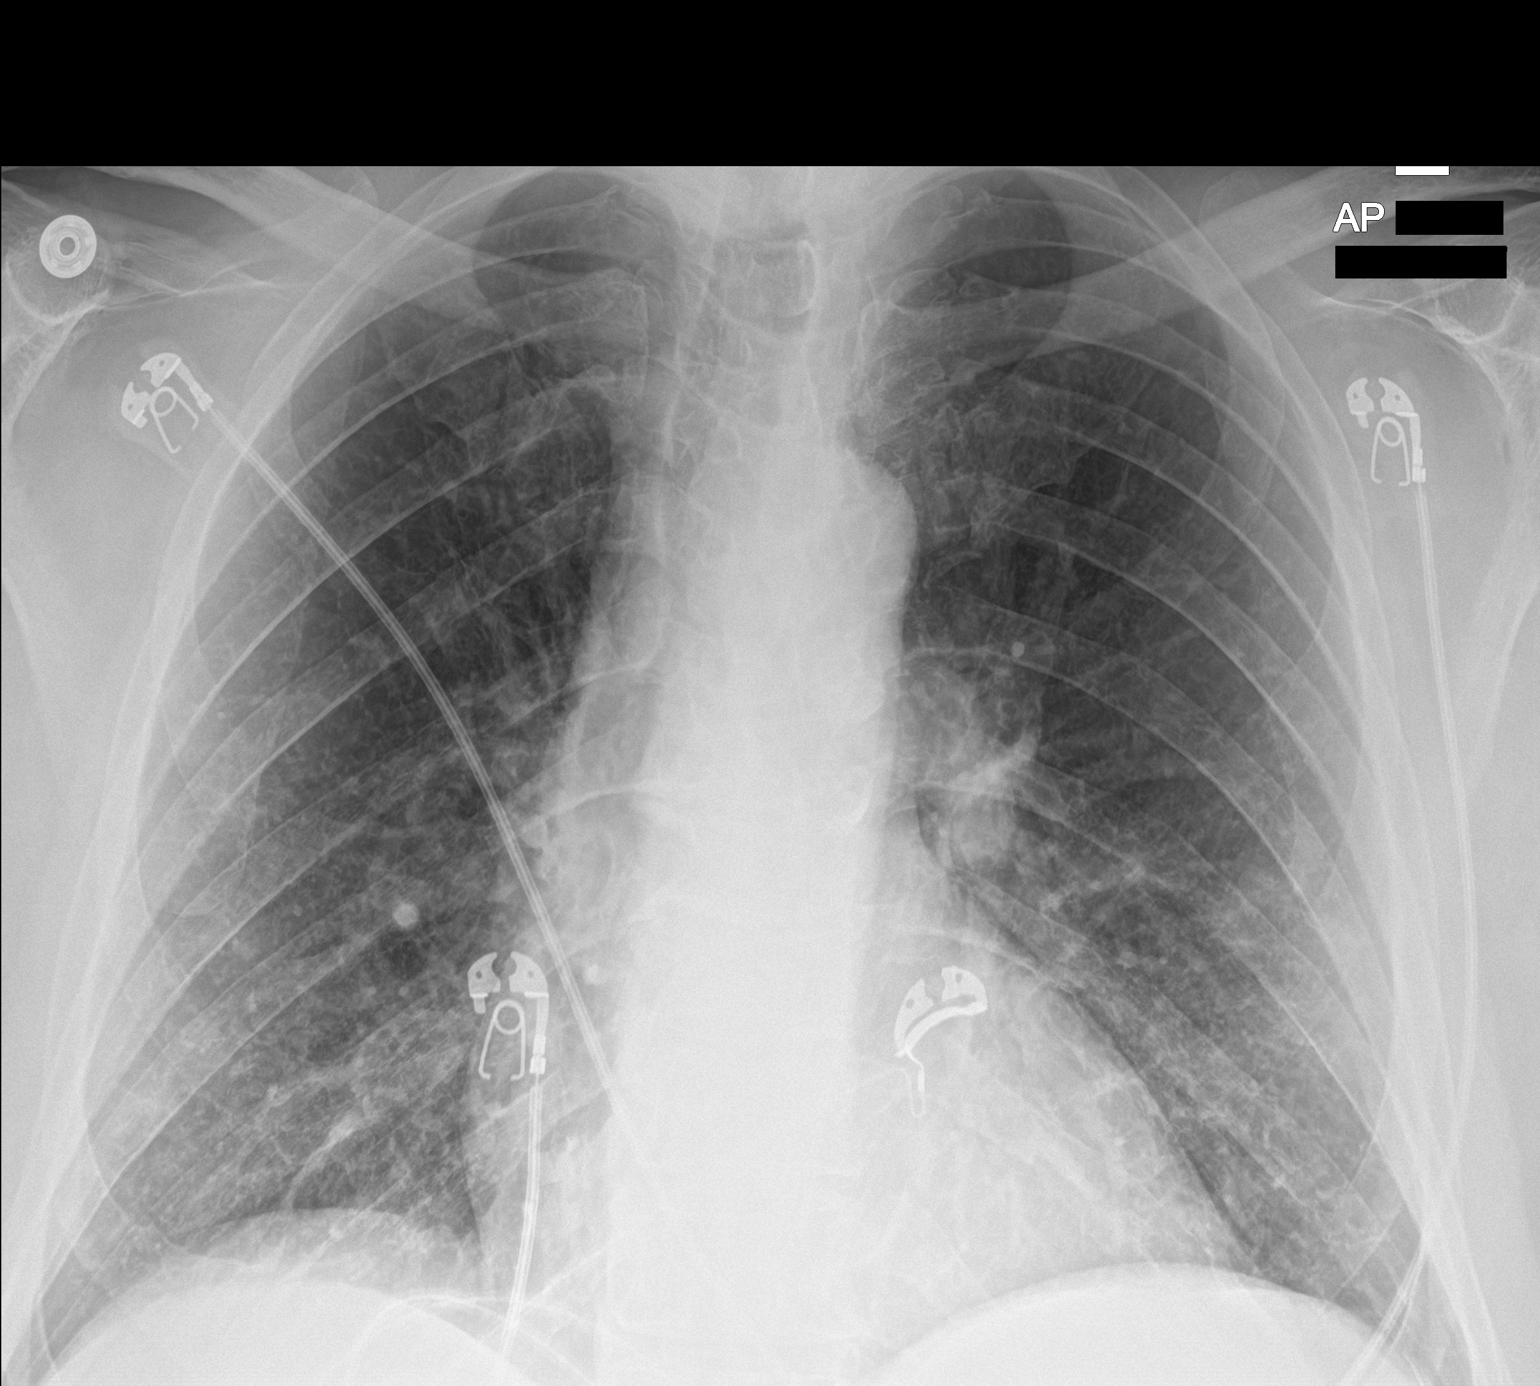
[im 2/2]
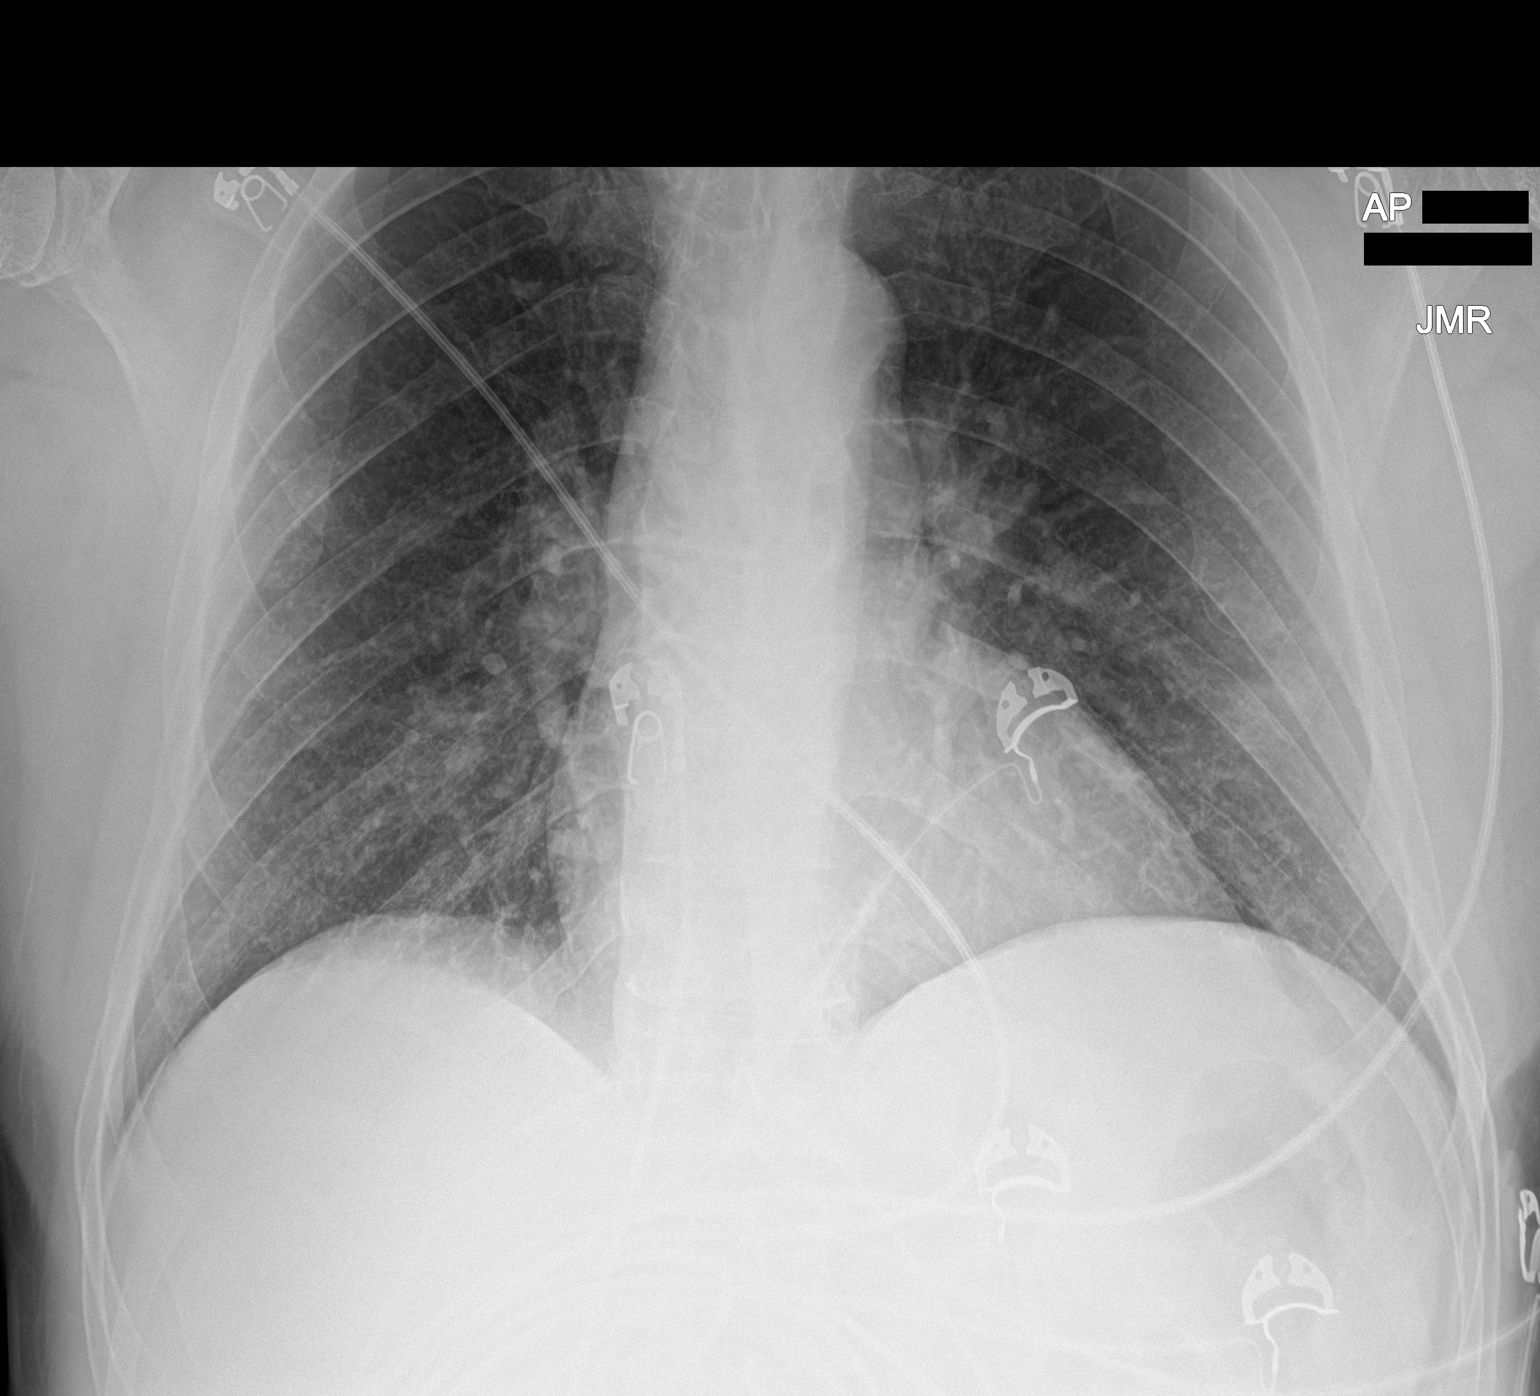

[2 of 2 positions shown; findings below may reference images not displayed]

FINDINGS: Cardiomediastinal silhouette is normal. No pneumothorax. No nodule
or mass. Suspicion for subtle developing infiltrate in the left
base.
IMPRESSION: Suspicion for subtle developing infiltrate in the left base.
Recommend attention on follow-up.

## 2023-02-13 ENCOUNTER — Ambulatory Visit
Admission: EM | Admit: 2023-02-13 | Discharge: 2023-02-13 | Disposition: A | Discharge status: Home / Self Care | Payer: Medicare Other | Attending: Nurse Practitioner | Admitting: Nurse Practitioner

## 2023-02-13 DIAGNOSIS — J069 Acute upper respiratory infection, unspecified: Secondary | ICD-10-CM | POA: Insufficient documentation

## 2023-02-13 DIAGNOSIS — Z1152 Encounter for screening for COVID-19: Secondary | ICD-10-CM | POA: Insufficient documentation

## 2023-02-13 DIAGNOSIS — R051 Acute cough: Secondary | ICD-10-CM | POA: Insufficient documentation

## 2023-02-13 MED ORDER — BENZONATATE 100 MG PO CAPS: 100.0000 mg | ORAL_CAPSULE | Freq: Three times a day (TID) | ORAL | 0 refills | Status: DC

## 2023-02-13 NOTE — ED Triage Notes (Addendum)
Pt c/o of cold sxs, cough, chest pain with cough, fatigue x a few days.

## 2023-02-13 NOTE — ED Provider Notes (Signed)
[facility]    CSN: [id number] Arrival date & time: 02/13/23  1503      History   Chief Complaint Chief Complaint  Patient presents with   Cough    HPI Dale Hines is a 66 y.o. male  presents for evaluation of URI symptoms for 4-5 days. Patient reports associated symptoms of cough, congestion, fatigue, chest pain with coughing only. Denies N/V/D, fevers, chills, body aches, ST, shortness of breath, ear pain. Patient does not have a hx of asthma or smoking. No known sick contacts.  Pt has taken nothing OTC for symptoms. Pt has no other concerns at this time.    Cough   Past Medical History:  Diagnosis Date   Headache     Patient Active Problem List   Diagnosis Date Noted   Diverticulitis of colon with perforation 02/11/2016   Acute diverticulitis 02/11/2016    History reviewed. No pertinent surgical history.     Home Medications    Prior to Admission medications   Medication Sig Start Date End Date Taking? Authorizing Provider  benzonatate (TESSALON) 100 MG capsule Take 1 capsule (100 mg total) by mouth every 8 (eight) hours. 02/13/23  Yes Melynda Ripple, NP  traZODone (DESYREL) 100 MG tablet Take by mouth. 08/06/17  Yes [provider]  amphetamine-dextroamphetamine (ADDERALL) 15 MG tablet Take 15 mg by mouth 2 (two) times daily.     [provider]  B Complex-C (SUPER B COMPLEX PO) Take 1 tablet by mouth daily with lunch.    [provider]  ciprofloxacin (CIPRO) 500 MG tablet Take 1 tablet (500 mg total) by mouth 2 (two) times daily. 02/14/16   Donne Hazel, MD  docusate sodium (COLACE) 100 MG capsule Take 1 capsule (100 mg total) by mouth 2 (two) times daily. 02/14/16   Donne Hazel, MD  metFORMIN (GLUCOPHAGE) 1000 MG tablet Take 1 tablet (1,000 mg total) by mouth daily with breakfast. 02/16/16   Argentina Donovan, PA-C  metroNIDAZOLE (FLAGYL) 500 MG tablet Take 1 tablet (500 mg total) by mouth every 8 (eight) hours.  02/14/16   Donne Hazel, MD  Multiple Vitamin (MULTIVITAMIN WITH MINERALS) TABS tablet Take 1 tablet by mouth daily.    [provider]  tadalafil (CIALIS) 5 MG tablet Take 5 mg by mouth daily.    [provider]  traZODone (DESYREL) 100 MG tablet Take 100 mg by mouth at bedtime.    [provider]    Family History Family History  Family history unknown: Yes    Social History Social History   Tobacco Use   Smoking status: Never  Substance Use Topics   Alcohol use: Yes    Comment: very little   Drug use: No     Allergies   Patient has no known allergies.   Review of Systems Review of Systems  Constitutional:  Positive for fatigue.  HENT:  Positive for congestion.   Respiratory:  Positive for cough.        CP with coughing only      Physical Exam Triage Vital Signs ED Triage Vitals  Enc Vitals Group     BP 02/13/23 1556 127/71     Pulse Rate 02/13/23 1556 92     Resp 02/13/23 1556 18     Temp 02/13/23 1556 98.6 F (37 C)     Temp Source 02/13/23 1556 Oral     SpO2 02/13/23 1556 94 %     Weight --  Height --      Head Circumference --      Peak Flow --      Pain Score 02/13/23 1550 2     Pain Loc --      Pain Edu? --      Excl. in [place]? --    No data found.  Updated Vital Signs BP 127/71 (BP Location: Left Arm)   Pulse 92   Temp 98.6 F (37 C) (Oral)   Resp 18   SpO2 94%   Visual Acuity Right Eye Distance:   Left Eye Distance:   Bilateral Distance:    Right Eye Near:   Left Eye Near:    Bilateral Near:     Physical Exam Vitals and nursing note reviewed.  Constitutional:      General: He is not in acute distress.    Appearance: Normal appearance. He is not ill-appearing or toxic-appearing.  HENT:     Head: Normocephalic and atraumatic.     Right Ear: Tympanic membrane and ear canal normal.     Left Ear: Tympanic membrane and ear canal normal.     Nose: Congestion present.     Mouth/Throat:     Mouth:  Mucous membranes are moist.     Pharynx: No oropharyngeal exudate or posterior oropharyngeal erythema.  Eyes:     Pupils: Pupils are equal, round, and reactive to light.  Cardiovascular:     Rate and Rhythm: Normal rate and regular rhythm.     Heart sounds: Normal heart sounds.  Pulmonary:     Effort: Pulmonary effort is normal.     Breath sounds: Normal breath sounds.  Musculoskeletal:     Cervical back: Normal range of motion and neck supple.  Lymphadenopathy:     Cervical: No cervical adenopathy.  Skin:    General: Skin is warm and dry.  Neurological:     General: No focal deficit present.     Mental Status: He is alert and oriented to person, place, and time.  Psychiatric:        Mood and Affect: Mood normal.        Behavior: Behavior normal.      UC Treatments / Results  Labs (all labs ordered are listed, but only abnormal results are displayed) Labs Reviewed  SARS CORONAVIRUS 2 (TAT 6-24 HRS)    EKG   Radiology No results found.  Procedures Procedures (including critical care time)  Medications Ordered in UC Medications - No data to display  Initial Impression / Assessment and Plan / UC Course  I have reviewed the triage vital signs and the nursing notes.  Pertinent labs & imaging results that were available during my care of the patient were reviewed by me and considered in my medical decision making (see chart for details).     Reviewed exam and symptoms with patient.  No red flags on exam. Discussed viral illness and symptomatic treatment COVID PCR and will contact if positive Tessalon as needed for cough Rest and fluids Follow-up with PCP 2 days for recheck ER precautions reviewed and patient verbalized understanding Final Clinical Impressions(s) / UC Diagnoses   Final diagnoses:  Acute cough  Viral upper respiratory illness     Discharge Instructions      The clinic will contact you with results of your COVID test is positive Tessalon  as needed Rest and fluids Follow-up with your PCP if your symptoms or not improving Please go to the emergency room if you have  any worsening symptoms   ED Prescriptions     Medication Sig Dispense Auth. Provider   benzonatate (TESSALON) 100 MG capsule Take 1 capsule (100 mg total) by mouth every 8 (eight) hours. 21 capsule Melynda Ripple, NP      PDMP not reviewed this encounter.   Melynda Ripple, NP 02/13/23 [phone]

## 2023-02-13 NOTE — Discharge Instructions (Signed)
The clinic will contact you with results of your COVID test is positive Tessalon as needed Rest and fluids Follow-up with your PCP if your symptoms or not improving Please go to the emergency room if you have any worsening symptoms

## 2023-02-14 LAB — SARS CORONAVIRUS 2 (TAT 6-24 HRS): SARS Coronavirus 2: NEGATIVE

## 2024-05-10 ENCOUNTER — Encounter (HOSPITAL_COMMUNITY): Payer: Self-pay

## 2024-05-10 ENCOUNTER — Other Ambulatory Visit: Payer: Self-pay

## 2024-05-10 ENCOUNTER — Emergency Department (HOSPITAL_COMMUNITY)

## 2024-05-10 ENCOUNTER — Emergency Department (HOSPITAL_COMMUNITY)
Admission: EM | Admit: 2024-05-10 | Discharge: 2024-05-10 | Disposition: A | Discharge status: Home / Self Care | Attending: Emergency Medicine | Admitting: Emergency Medicine

## 2024-05-10 DIAGNOSIS — R1032 Left lower quadrant pain: Secondary | ICD-10-CM

## 2024-05-10 DIAGNOSIS — N132 Hydronephrosis with renal and ureteral calculous obstruction: Secondary | ICD-10-CM | POA: Diagnosis not present

## 2024-05-10 DIAGNOSIS — N200 Calculus of kidney: Secondary | ICD-10-CM

## 2024-05-10 LAB — CBC
HCT: 44.7 % (ref 39.0–52.0)
Hemoglobin: 15.1 g/dL (ref 13.0–17.0)
MCH: 31.6 pg (ref 26.0–34.0)
MCHC: 33.8 g/dL (ref 30.0–36.0)
MCV: 93.5 fL (ref 80.0–100.0)
Platelets: 191 10*3/uL (ref 150–400)
RBC: 4.78 MIL/uL (ref 4.22–5.81)
RDW: 13 % (ref 11.5–15.5)
WBC: 9.4 10*3/uL (ref 4.0–10.5)
nRBC: 0 % (ref 0.0–0.2)

## 2024-05-10 LAB — URINALYSIS, ROUTINE W REFLEX MICROSCOPIC
Bacteria, UA: NONE SEEN
Bilirubin Urine: NEGATIVE
Glucose, UA: NEGATIVE mg/dL
Ketones, ur: 5 mg/dL — AB
Leukocytes,Ua: NEGATIVE
Nitrite: NEGATIVE
Protein, ur: NEGATIVE mg/dL
RBC / HPF: >50 RBC/hpf (ref 0–5)
Specific Gravity, Urine: 1.02 (ref 1.005–1.030)
pH: 5 (ref 5.0–8.0)

## 2024-05-10 LAB — COMPREHENSIVE METABOLIC PANEL WITH GFR
ALT: 31 U/L (ref 0–44)
AST: 30 U/L (ref 15–41)
Albumin: 4.5 g/dL (ref 3.5–5.0)
Alkaline Phosphatase: 94 U/L (ref 38–126)
Anion gap: 8 (ref 5–15)
BUN: 22 mg/dL (ref 8–23)
CO2: 20 mmol/L — ABNORMAL LOW (ref 22–32)
Calcium: 9 mg/dL (ref 8.9–10.3)
Chloride: 108 mmol/L (ref 98–111)
Creatinine, Ser: 1.21 mg/dL (ref 0.61–1.24)
GFR, Estimated: >60 mL/min (ref >60)
Glucose, Bld: 164 mg/dL — ABNORMAL HIGH (ref 70–99)
Potassium: 3.7 mmol/L (ref 3.5–5.1)
Sodium: 136 mmol/L (ref 135–145)
Total Bilirubin: 0.7 mg/dL (ref 0.0–1.2)
Total Protein: 7.6 g/dL (ref 6.5–8.1)

## 2024-05-10 LAB — LIPASE, BLOOD: Lipase: 56 U/L — ABNORMAL HIGH (ref 11–51)

## 2024-05-10 MED ORDER — HYDROCODONE-ACETAMINOPHEN 5-325 MG PO TABS: 2.0000 | ORAL_TABLET | ORAL | 0 refills | Status: DC | PRN

## 2024-05-10 MED ORDER — ONDANSETRON HCL 4 MG/2ML IJ SOLN
4.0000 mg | Freq: Once | INTRAMUSCULAR | Status: AC
  Administered 2024-05-10: 4 mg via INTRAVENOUS
  Filled 2024-05-10: qty 2

## 2024-05-10 MED ORDER — IOHEXOL 300 MG/ML  SOLN
100.0000 mL | Freq: Once | INTRAMUSCULAR | Status: AC | PRN
  Administered 2024-05-10: 100 mL via INTRAVENOUS

## 2024-05-10 MED ORDER — TAMSULOSIN HCL 0.4 MG PO CAPS: 0.4000 mg | ORAL_CAPSULE | Freq: Every day | ORAL | 0 refills | Status: AC

## 2024-05-10 MED ORDER — ONDANSETRON HCL 4 MG PO TABS: 4.0000 mg | ORAL_TABLET | Freq: Four times a day (QID) | ORAL | 0 refills | Status: DC

## 2024-05-10 MED ORDER — MORPHINE SULFATE (PF) 4 MG/ML IV SOLN
4.0000 mg | Freq: Once | INTRAVENOUS | Status: AC
  Administered 2024-05-10: 4 mg via INTRAVENOUS
  Filled 2024-05-10: qty 1

## 2024-05-10 NOTE — ED Provider Notes (Signed)
  Physical Exam  BP (!) 176/91 (BP Location: Left Arm)   Pulse 95   Temp 98 F (36.7 C) (Oral)   Resp 19   Ht 5\' 7"  (1.702 m)   Wt 86.2 kg   SpO2 100%   BMI 29.76 kg/m   Physical Exam Vitals and nursing note reviewed.  Constitutional:      General: He is not in acute distress.    Appearance: He is well-developed. He is not ill-appearing.  Cardiovascular:     Rate and Rhythm: Normal rate and regular rhythm.  Pulmonary:     Effort: Pulmonary effort is normal. No respiratory distress.  Neurological:     General: No focal deficit present.     Mental Status: He is alert and oriented to person, place, and time.  Psychiatric:        Mood and Affect: Mood normal.        Behavior: Behavior normal.     Procedures  Procedures  ED Course / MDM    Medical Decision Making Amount and/or Complexity of Data Reviewed Labs: ordered. Radiology: ordered.  Risk Prescription drug management.  Patient care transferred over from Editha Goring, PA-C. Patient reports LLQ pain starting last night. Waiting for CT scan, some blood in urine. Rule out diverticulitis vs gas vs nephrolithiasis.   CT scan showed 6 mm stone present in the left UPJ with mild dilation and mild stranding.  Also noted small cysts present in the left kidney  measuring 3.4 cm on the left and on the right kidney noted a 4.6 cm cyst.  Urinary bladder is decompressed.  Will have patient follow-up with urology in a close outpatient setting and will prescribe Flomax, nausea medication and pain control.  Provided precautions and using these medications and insisted on patient follow-up as size of stone notes that he may need to follow-up with urology if unable to pass it.  Patient vital signs have remained stable throughout the course of patient's time in the ED. Low suspicion for any other emergent pathology at this time. I believe this patient is safe to be discharged. Provided strict return to ER precautions. Patient expressed  agreement and understanding of plan. All questions were answered.     Hayes Lipps, [place] 05/10/24 0941    Lind Repine, MD 05/10/24 [phone]

## 2024-05-10 NOTE — ED Triage Notes (Signed)
 Pt reports sudden onset of left lower abdominal pain that woke him up. Pt does report associated nausea.

## 2024-05-10 NOTE — Discharge Instructions (Addendum)
 You were seen today for kidney stone to the left kidney.  It did show mild kidney swelling from the stone, this is not uncommon in the setting of a kidney stone.  Due to the size, being 6 mm, recommend you follow-up with urology in an outpatient setting.  I have attached information to urology above which you are to call their clinic and schedule an appointment.  As this may not pass on its own.  I am also prescribing you pain medication, nausea medication as well as Flomax which will help in the expulsion of the stone.  I am sending in a narcotic medication which you can take as needed every 4 hours, however recommend take Tylenol  first to help manage pain and use this if that is unable to help the pain.  This medication also has Tylenol  in this medication, please do not take more than 4000mg  of Tylenol  total in a 24-hour period.  Your imaging also did not note to cysts on your kidneys, recommend you follow-up with your PCP for this.  They were noted to appear to be simple by radiology.  However recommend you continue to have this evaluated by urology and PCP in a outpatient setting.  Return to the ED if you begin to have any new or worsening symptoms including fever, uncontrolled pain, persistent vomiting despite Zofran , shortness of breath.

## 2024-05-10 NOTE — ED Provider Notes (Signed)
 [place] EMERGENCY DEPARTMENT AT [facility] Provider Note   CSN: [id number] Arrival date & time: 05/10/24  0448     History  Chief Complaint  Patient presents with   Abdominal Pain    Dale Hines is a 68 y.o. male.  68 year old male presents with complaint of left lower quadrant abdominal pain sudden onset around 2 AM associated with nausea.  Denies changes in bowel or bladder habits, fevers, chills.  States he had diverticulitis years ago.  No prior abdominal surgeries.  No other complaints or concerns.       Home Medications Prior to Admission medications   Medication Sig Start Date End Date Taking? Authorizing Provider  amphetamine-dextroamphetamine (ADDERALL) 15 MG tablet Take 15 mg by mouth 2 (two) times daily.     [provider]  B Complex-C (SUPER B COMPLEX PO) Take 1 tablet by mouth daily with lunch.    [provider]  benzonatate  (TESSALON ) 100 MG capsule Take 1 capsule (100 mg total) by mouth every 8 (eight) hours. 02/13/23   Mayer, Jodi R, NP  ciprofloxacin  (CIPRO ) 500 MG tablet Take 1 tablet (500 mg total) by mouth 2 (two) times daily. 02/14/16   Oral Billings, MD  docusate sodium  (COLACE) 100 MG capsule Take 1 capsule (100 mg total) by mouth 2 (two) times daily. 02/14/16   Oral Billings, MD  metFORMIN  (GLUCOPHAGE ) 1000 MG tablet Take 1 tablet (1,000 mg total) by mouth daily with breakfast. 02/16/16   Hassie Lint, PA-C  metroNIDAZOLE  (FLAGYL ) 500 MG tablet Take 1 tablet (500 mg total) by mouth every 8 (eight) hours. 02/14/16   Oral Billings, MD  Multiple Vitamin (MULTIVITAMIN WITH MINERALS) TABS tablet Take 1 tablet by mouth daily.    [provider]  tadalafil (CIALIS) 5 MG tablet Take 5 mg by mouth daily.    [provider]  traZODone (DESYREL) 100 MG tablet Take 100 mg by mouth at bedtime.    [provider]  traZODone (DESYREL) 100 MG tablet Take by mouth. 08/06/17   [provider]       Allergies    Patient has no known allergies.    Review of Systems   Review of Systems Negative except as per HPI Physical Exam Updated Vital Signs BP (!) 176/91 (BP Location: Left Arm)   Pulse 95   Temp 98 F (36.7 C) (Oral)   Resp 19   Ht 5\' 7"  (1.702 m)   Wt 86.2 kg   SpO2 100%   BMI 29.76 kg/m  Physical Exam Vitals and nursing note reviewed.  Constitutional:      General: He is not in acute distress.    Appearance: He is well-developed. He is not diaphoretic.  HENT:     Head: Normocephalic and atraumatic.  Cardiovascular:     Rate and Rhythm: Normal rate and regular rhythm.     Heart sounds: Normal heart sounds.  Pulmonary:     Effort: Pulmonary effort is normal.     Breath sounds: Normal breath sounds.  Abdominal:     Palpations: Abdomen is soft.     Tenderness: There is abdominal tenderness in the left lower quadrant.     Hernia: No hernia is present.  Skin:    General: Skin is warm and dry.     Findings: No erythema or rash.  Neurological:     Mental Status: He is alert and oriented to person, place, and time.  Psychiatric:  Behavior: Behavior normal.     ED Results / Procedures / Treatments   Labs (all labs ordered are listed, but only abnormal results are displayed) Labs Reviewed  LIPASE, BLOOD - Abnormal; Notable for the following components:      Result Value   Lipase 56 (*)    All other components within normal limits  COMPREHENSIVE METABOLIC PANEL WITH GFR - Abnormal; Notable for the following components:   CO2 20 (*)    Glucose, Bld 164 (*)    All other components within normal limits  URINALYSIS, ROUTINE W REFLEX MICROSCOPIC - Abnormal; Notable for the following components:   APPearance HAZY (*)    Hgb urine dipstick LARGE (*)    Ketones, ur 5 (*)    All other components within normal limits  CBC    EKG None  Radiology No results found.  Procedures Procedures    Medications Ordered in ED Medications  ondansetron   (ZOFRAN ) injection 4 mg (4 mg Intravenous Given 05/10/24 0558)  morphine  (PF) 4 MG/ML injection 4 mg (4 mg Intravenous Given 05/10/24 1610)    ED Course/ Medical Decision Making/ A&P                                 Medical Decision Making Amount and/or Complexity of Data Reviewed Labs: ordered. Radiology: ordered.  Risk Prescription drug management.   This patient presents to the ED for concern of LLQ pain, this involves an extensive number of treatment options, and is a complaint that carries with it a high risk of complications and morbidity.  The differential diagnosis includes diverticulitis, colitis, constipation, mass, kidney stone   Co morbidities / Chronic conditions that complicate the patient evaluation  Diverticulitis, diabetes   Additional history obtained:  Additional history obtained from EMR External records from outside source obtained and reviewed including prior labs on file for comparison   Lab Tests:  I Ordered, and personally interpreted labs.  The pertinent results include: CMP without significant findings.  Lipase not significantly elevated.  CBC within normal notes.  Urinalysis positive for hemoglobin and ketones.   Imaging Studies ordered:  I ordered imaging studies including CT abdomen and pelvis Results pending at time of signout provider   Problem List / ED Course / Critical interventions / Medication management  68 year old male presents with complaint of left lower quadrant abdominal pain onset last night.  History of diverticulitis.  He thinks that his pain is related to dinner he ate last night.  Care signed out to oncoming provider at change of shift pending CT.  Labs revealing hemoglobin, consider possible kidney stone. I ordered medication including morphine , Zofran  for pain Reevaluation of the patient after these medicines showed that the patient pain improved I have reviewed the patients home medicines and have made adjustments as  needed    Social Determinants of Health:  Has PCP, lives with family   Test / Admission - Considered:  Disposition pending at time of signout time provider         Final Clinical Impression(s) / ED Diagnoses Final diagnoses:  None    Rx / DC Orders ED Discharge Orders     None         Darlis Eisenmenger, PA-C 05/10/24 0748    Earma Gloss, MD 05/10/24 [phone]

## 2024-05-26 ENCOUNTER — Other Ambulatory Visit: Payer: Self-pay | Admitting: Urology

## 2024-05-26 DIAGNOSIS — N201 Calculus of ureter: Secondary | ICD-10-CM

## 2024-05-27 ENCOUNTER — Encounter: Payer: Self-pay | Admitting: Urology

## 2024-05-27 ENCOUNTER — Ambulatory Visit (HOSPITAL_BASED_OUTPATIENT_CLINIC_OR_DEPARTMENT_OTHER)
Admission: RE | Admit: 2024-05-27 | Discharge: 2024-05-27 | Disposition: A | Discharge status: Home / Self Care | Source: Ambulatory Visit | Attending: Urology | Admitting: Urology

## 2024-05-27 ENCOUNTER — Ambulatory Visit: Admitting: Urology

## 2024-05-27 VITALS — BP 150/79 | HR 66 | Ht 67.0 in | Wt 187.0 lb

## 2024-05-27 DIAGNOSIS — N201 Calculus of ureter: Secondary | ICD-10-CM | POA: Insufficient documentation

## 2024-05-27 LAB — URINALYSIS, ROUTINE W REFLEX MICROSCOPIC
Bilirubin, UA: NEGATIVE
Glucose, UA: NEGATIVE
Ketones, UA: NEGATIVE
Leukocytes,UA: NEGATIVE
Nitrite, UA: NEGATIVE
Protein,UA: NEGATIVE
Specific Gravity, UA: >1.03 — ABNORMAL HIGH (ref 1.005–1.030)
Urobilinogen, Ur: 0.2 mg/dL (ref 0.2–1.0)
pH, UA: 5.5 (ref 5.0–7.5)

## 2024-05-27 LAB — MICROSCOPIC EXAMINATION

## 2024-05-27 NOTE — Progress Notes (Signed)
 Assessment: 1. Ureteral calculus, left     Plan: I personally reviewed the patient's chart including provider notes, lab and imaging results. I personally reviewed the CT study from 05/10/2024 with results as noted below. I reviewed the KUB study from today.  A 6 x 3 mm calcification is seen at the tip of L3 transverse process.  Diagnosis and treatment options for a ureteral calculus including spontaneous stone passage and ureteroscopic stone manipulation were discussed with [name] in detail. He has elected to attempt to pass the ureteral calculus. Strain urine, Pain medication as needed.  Rx provided., and Alpha blocker for medical expulsive therapy.  Off label use discussed.  Use and side effects reviewed.  Rx provided. Follow-up in 2 weeks with KUB. Return to office or ER for uncontrolled pain, vomiting, fever, or chills.   Chief Complaint:  Chief Complaint  Patient presents with   Nephrolithiasis    History of Present Illness:  Dale Hines is a 68 y.o. male who is seen for evaluation of a left ureteral calculus. He was seen in the emergency room on 05/10/2024 with left-sided abdominal pain. CT imaging showed a 3 x 6 mm stone in the left proximal ureter near the UPJ with mild left-sided dilation. Urinalysis showed >50 RBCs.  Creatinine normal at 1.21. He was discharged with pain medication and tamsulosin . He has not had any further episodes of pain.  No nausea, vomiting, fever, or chills.  No dysuria or gross hematuria.  He is not aware of passing a stone but has not been straining his urine.  He is no longer taking tamsulosin . No prior history of kidney stones.   Past Medical History:  Past Medical History:  Diagnosis Date   Headache     Past Surgical History:  No past surgical history on file.  Allergies:  No Known Allergies  Family History:  Family History  Family history unknown: Yes    Social History:  Social History   Tobacco Use    Smoking status: Never  Substance Use Topics   Alcohol use: Yes    Comment: very little   Drug use: No    Review of symptoms:  Constitutional:  Negative for unexplained weight loss, night sweats, fever, chills ENT:  Negative for nose bleeds, sinus pain, painful swallowing CV:  Negative for chest pain, shortness of breath, exercise intolerance, palpitations, loss of consciousness Resp:  Negative for cough, wheezing, shortness of breath GI:  Negative for nausea, vomiting, diarrhea, bloody stools GU:  Positives noted in HPI; otherwise negative for gross hematuria, dysuria, urinary incontinence Neuro:  Negative for seizures, poor balance, limb weakness, slurred speech Psych:  Negative for lack of energy, depression, anxiety Endocrine:  Negative for polydipsia, polyuria, symptoms of hypoglycemia (dizziness, hunger, sweating) Hematologic:  Negative for anemia, purpura, petechia, prolonged or excessive bleeding, use of anticoagulants  Allergic:  Negative for difficulty breathing or choking as a result of exposure to anything; no shellfish allergy; no allergic response (rash/itch) to materials, foods  Physical exam: BP (!) 150/79   Pulse 66   Ht 5' 7 (1.702 m)   Wt 187 lb (84.8 kg)   BMI 29.29 kg/m  GENERAL APPEARANCE:  Well appearing, well developed, well nourished, NAD HEENT: Atraumatic, Normocephalic, oropharynx clear. NECK: Supple without lymphadenopathy or thyromegaly. LUNGS: Clear to auscultation bilaterally. HEART: Regular Rate and Rhythm without murmurs, gallops, or rubs. ABDOMEN: Soft, non-tender, No Masses. EXTREMITIES: Moves all extremities well.  Without clubbing, cyanosis, or edema. NEUROLOGIC:  Alert and  oriented x 3, normal gait, CN II-XII grossly intact.  MENTAL STATUS:  Appropriate. BACK:  Non-tender to palpation.  No CVAT SKIN:  Warm, dry and intact.    Results: Results for orders placed or performed in visit on 05/27/24 (from the past 24 hours)  Urinalysis, Routine  w reflex microscopic     Status: Abnormal   Collection Time: 05/27/24 12:00 AM  Result Value Ref Range   Specific Gravity, UA >1.030 (H) 1.005 - 1.030   pH, UA 5.5 5.0 - 7.5   Color, UA Yellow Yellow   Appearance Ur Clear Clear   Leukocytes,UA Negative Negative   Protein,UA Negative Negative/Trace   Glucose, UA Negative Negative   Ketones, UA Negative Negative   RBC, UA 1+ (A) Negative   Bilirubin, UA Negative Negative   Urobilinogen, Ur 0.2 0.2 - 1.0 mg/dL   Nitrite, UA Negative Negative   Microscopic Examination See below:    Narrative   Performed at:  [facility], [facility]  Urology [facility] [address], [place], [place]  [id number] Lab Director: Estill Hemming [place], Phone:  [phone]  Microscopic Examination     Status: Abnormal   Collection Time: 05/27/24 12:00 AM   Urine  Result Value Ref Range   WBC, UA 0-5 0 - 5 /hpf   RBC, Urine 3-10 (A) 0 - 2 /hpf   Epithelial Cells (non renal) 0-10 0 - 10 /hpf   Mucus, UA Present (A) Not Estab.   Bacteria, UA Few None seen/Few   Narrative   Performed at:  [facility]  Urology [facility] [address], [place], [place]  [id number] Lab Director: Estill Hemming [place], Phone:  [phone]

## 2024-05-31 ENCOUNTER — Other Ambulatory Visit: Payer: Self-pay | Admitting: Urology

## 2024-05-31 ENCOUNTER — Telehealth: Payer: Self-pay | Admitting: Internal Medicine

## 2024-05-31 ENCOUNTER — Other Ambulatory Visit: Payer: Self-pay

## 2024-05-31 ENCOUNTER — Encounter: Payer: Self-pay | Admitting: Urology

## 2024-05-31 DIAGNOSIS — N201 Calculus of ureter: Secondary | ICD-10-CM

## 2024-05-31 MED ORDER — OXYCODONE HCL 5 MG PO TABS
5.0000 mg | ORAL_TABLET | Freq: Four times a day (QID) | ORAL | 0 refills | Status: DC | PRN
  Filled 2024-05-31: qty 15, 4d supply, fill #0

## 2024-05-31 NOTE — Telephone Encounter (Signed)
 Patient called and stated he is in a lot of pain with and he has taken all the pain medication you sent in. He is wondering if you can send in more and something more stronger. Please Advise.

## 2024-06-10 ENCOUNTER — Ambulatory Visit: Payer: Self-pay | Admitting: Urology

## 2024-06-10 ENCOUNTER — Ambulatory Visit (HOSPITAL_BASED_OUTPATIENT_CLINIC_OR_DEPARTMENT_OTHER)
Admission: RE | Admit: 2024-06-10 | Discharge: 2024-06-10 | Disposition: A | Discharge status: Home / Self Care | Source: Ambulatory Visit | Attending: Urology | Admitting: Urology

## 2024-06-10 ENCOUNTER — Ambulatory Visit: Admitting: Urology

## 2024-06-10 ENCOUNTER — Encounter: Payer: Self-pay | Admitting: Urology

## 2024-06-10 VITALS — BP 132/82 | HR 71 | Ht 67.0 in | Wt 180.0 lb

## 2024-06-10 DIAGNOSIS — N201 Calculus of ureter: Secondary | ICD-10-CM

## 2024-06-10 LAB — URINALYSIS, ROUTINE W REFLEX MICROSCOPIC
Bilirubin, UA: NEGATIVE
Glucose, UA: NEGATIVE
Ketones, UA: NEGATIVE
Leukocytes,UA: NEGATIVE
Nitrite, UA: NEGATIVE
Protein,UA: NEGATIVE
Specific Gravity, UA: 1.025 (ref 1.005–1.030)
Urobilinogen, Ur: 0.2 mg/dL (ref 0.2–1.0)
pH, UA: 5.5 (ref 5.0–7.5)

## 2024-06-10 LAB — MICROSCOPIC EXAMINATION

## 2024-06-10 NOTE — Progress Notes (Signed)
 Assessment: 1. Ureteral calculus, left     Plan: I discussed the KUB results with the patient today.  His stone has not changed in position for approximately 4 weeks now.  I again discussed treatment options for the ureteral calculus including spontaneous passage, shockwave lithotripsy, and ureteroscopic laser lithotripsy.  Risk and benefits of each treatment reviewed.  Following our discussion, he elects to proceed with shockwave lithotripsy. Continue tamsulosin  Continue to strain urine   Procedure: The patient will be scheduled for left ESL on 06/21/24 at [facility].  Surgical request is placed with the surgery schedulers and will be scheduled at the patient's/family request. Informed consent is given as documented below. Anesthesia: local  The patient does not have sleep apnea, history of MRSA, history of VRE, history of cardiac device requiring special anesthetic needs. Patient is stable and considered clear for surgical management in an outpatient ambulatory surgery setting as well as inpatient hospital setting.  Consent for Operation or Procedure: Provider Certification I hereby certify that the nature, purpose, benefits, usual and most frequent risks of, and alternatives to, the operation or procedure have been explained to the patient (or person authorized to sign for the patient) either by me as responsible physician or by the provider who is to perform the operation or procedure. Time spent such that the patient/family has had an opportunity to ask questions, and that those questions have been answered. The patient or the patient's representative has been advised that selected tasks may be performed by assistants to the primary health care provider(s). I believe that the patient (or person authorized to sign for the patient) understands what has been explained, and has consented to the operation or procedure. No guarantees were implied or made.   Chief Complaint:  Chief Complaint   Patient presents with   Nephrolithiasis    History of Present Illness:  Dale Hines is a 68 y.o. male who is seen for continued evaluation of a left ureteral calculus. He was seen in the emergency room on 05/10/2024 with left-sided abdominal pain. CT imaging showed a 3 x 6 mm stone in the left proximal ureter near the UPJ with mild left-sided dilation. Urinalysis showed >50 RBCs.  Creatinine normal at 1.21. He was discharged with pain medication and tamsulosin . At his visit on 05/27/24, he had not had any further episodes of pain.  No nausea, vomiting, fever, or chills.  No dysuria or gross hematuria.   No prior history of kidney stones. KUB from 05/27/2024 showed a 6 x 3 mm calcification at the tip of the left L3 transverse process. He elected to attempt spontaneous passage. He returns today for follow-up. He has had several episodes of pain since his last visit.  He has responded to oral pain medication.  No fevers, chills, nausea, or vomiting. He continues on tamsulosin .  He has not passed a stone.  Portions of the above documentation were copied from a prior visit for review purposes only.    Past Medical History:  Past Medical History:  Diagnosis Date   Headache     Past Surgical History:  No past surgical history on file.  Allergies:  No Known Allergies  Family History:  Family History  Family history unknown: Yes    Social History:  Social History   Tobacco Use   Smoking status: Never  Substance Use Topics   Alcohol use: Yes    Comment: very little   Drug use: No    ROS: Constitutional:  Negative for  fever, chills, weight loss CV: Negative for chest pain, previous MI, hypertension Respiratory:  Negative for shortness of breath, wheezing, sleep apnea, frequent cough GI:  Negative for nausea, vomiting, bloody stool, GERD  Physical exam: BP 132/82   Pulse 71   Ht 5' 7 (1.702 m)   Wt 180 lb (81.6 kg)   BMI 28.19 kg/m  GENERAL APPEARANCE:  Well  appearing, well developed, well nourished, NAD HEENT:  Atraumatic, normocephalic, oropharynx clear NECK:  Supple without lymphadenopathy or thyromegaly ABDOMEN:  Soft, non-tender, no masses EXTREMITIES:  Moves all extremities well, without clubbing, cyanosis, or edema NEUROLOGIC:  Alert and oriented x 3, normal gait, CN II-XII grossly intact MENTAL STATUS:  appropriate BACK:  Non-tender to palpation, No CVAT SKIN:  Warm, dry, and intact  Results: U/A: 0-5 WBC, 3-10 RBC  KUB today:  4 x 6 mm calcification at left L3 transverse process

## 2024-06-10 NOTE — H&P (View-Only) (Signed)
 Assessment: 1. Ureteral calculus, left     Plan: I discussed the KUB results with the patient today.  His stone has not changed in position for approximately 4 weeks now.  I again discussed treatment options for the ureteral calculus including spontaneous passage, shockwave lithotripsy, and ureteroscopic laser lithotripsy.  Risk and benefits of each treatment reviewed.  Following our discussion, he elects to proceed with shockwave lithotripsy. Continue tamsulosin  Continue to strain urine   Procedure: The patient will be scheduled for left ESL on 06/21/24 at [facility].  Surgical request is placed with the surgery schedulers and will be scheduled at the patient's/family request. Informed consent is given as documented below. Anesthesia: local  The patient does not have sleep apnea, history of MRSA, history of VRE, history of cardiac device requiring special anesthetic needs. Patient is stable and considered clear for surgical management in an outpatient ambulatory surgery setting as well as inpatient hospital setting.  Consent for Operation or Procedure: Provider Certification I hereby certify that the nature, purpose, benefits, usual and most frequent risks of, and alternatives to, the operation or procedure have been explained to the patient (or person authorized to sign for the patient) either by me as responsible physician or by the provider who is to perform the operation or procedure. Time spent such that the patient/family has had an opportunity to ask questions, and that those questions have been answered. The patient or the patient's representative has been advised that selected tasks may be performed by assistants to the primary health care provider(s). I believe that the patient (or person authorized to sign for the patient) understands what has been explained, and has consented to the operation or procedure. No guarantees were implied or made.   Chief Complaint:  Chief Complaint   Patient presents with   Nephrolithiasis    History of Present Illness:  Dale Hines is a 68 y.o. male who is seen for continued evaluation of a left ureteral calculus. He was seen in the emergency room on 05/10/2024 with left-sided abdominal pain. CT imaging showed a 3 x 6 mm stone in the left proximal ureter near the UPJ with mild left-sided dilation. Urinalysis showed >50 RBCs.  Creatinine normal at 1.21. He was discharged with pain medication and tamsulosin . At his visit on 05/27/24, he had not had any further episodes of pain.  No nausea, vomiting, fever, or chills.  No dysuria or gross hematuria.   No prior history of kidney stones. KUB from 05/27/2024 showed a 6 x 3 mm calcification at the tip of the left L3 transverse process. He elected to attempt spontaneous passage. He returns today for follow-up. He has had several episodes of pain since his last visit.  He has responded to oral pain medication.  No fevers, chills, nausea, or vomiting. He continues on tamsulosin .  He has not passed a stone.  Portions of the above documentation were copied from a prior visit for review purposes only.    Past Medical History:  Past Medical History:  Diagnosis Date   Headache     Past Surgical History:  No past surgical history on file.  Allergies:  No Known Allergies  Family History:  Family History  Family history unknown: Yes    Social History:  Social History   Tobacco Use   Smoking status: Never  Substance Use Topics   Alcohol use: Yes    Comment: very little   Drug use: No    ROS: Constitutional:  Negative for  fever, chills, weight loss CV: Negative for chest pain, previous MI, hypertension Respiratory:  Negative for shortness of breath, wheezing, sleep apnea, frequent cough GI:  Negative for nausea, vomiting, bloody stool, GERD  Physical exam: BP 132/82   Pulse 71   Ht 5' 7 (1.702 m)   Wt 180 lb (81.6 kg)   BMI 28.19 kg/m  GENERAL APPEARANCE:  Well  appearing, well developed, well nourished, NAD HEENT:  Atraumatic, normocephalic, oropharynx clear NECK:  Supple without lymphadenopathy or thyromegaly ABDOMEN:  Soft, non-tender, no masses EXTREMITIES:  Moves all extremities well, without clubbing, cyanosis, or edema NEUROLOGIC:  Alert and oriented x 3, normal gait, CN II-XII grossly intact MENTAL STATUS:  appropriate BACK:  Non-tender to palpation, No CVAT SKIN:  Warm, dry, and intact  Results: U/A: 0-5 WBC, 3-10 RBC  KUB today:  4 x 6 mm calcification at left L3 transverse process

## 2024-06-11 ENCOUNTER — Encounter (HOSPITAL_COMMUNITY): Payer: Self-pay | Admitting: Urology

## 2024-06-15 ENCOUNTER — Encounter (HOSPITAL_COMMUNITY): Payer: Self-pay | Admitting: Urology

## 2024-06-15 NOTE — Progress Notes (Signed)
 Spoke w/ via phone for pre-op interview---patient Lab needs dos----KUB         Lab results------ COVID test ---Not indicated--patient states asymptomatic no test needed Arrive at -------1145 NPO after MN NO Solid Food.  Clear liquids from MN until---0730 Pre-Surgery Ensure or G2:  Med rec completed Medications to take morning of surgery -----Flomax , Cialis, Adderall, Oxy, if needed, glucophage  if eats breakfast Diabetic medication -----1/2 glucophage  if eats breakfast and hold if no breakfast  GLP1 agonist last dose: GLP1 instructions:  Patient instructed no nail polish to be worn day of surgery Patient instructed to bring photo id and insurance card day of surgery Patient aware to have Driver (ride ) / caregiver    for 24 hours after surgery - spouse- Rosina [phone] Patient Special Instructions -----Take laxative of choice on Sunday, eat light meal that evening and hydrate well, bring driver's license, insurance card and blue folder Pre-Op special Instructions -----per Norita Dustman and [place]  Patient verbalized understanding of instructions that were given at this phone interview. Patient denies chest pain, sob, fever, cough at the interview.

## 2024-06-21 ENCOUNTER — Ambulatory Visit (HOSPITAL_COMMUNITY): Admission: RE | Admit: 2024-06-21 | Source: Home / Self Care | Admitting: Urology

## 2024-06-21 DIAGNOSIS — E119 Type 2 diabetes mellitus without complications: Secondary | ICD-10-CM | POA: Diagnosis not present

## 2024-06-21 DIAGNOSIS — N201 Calculus of ureter: Secondary | ICD-10-CM | POA: Diagnosis present

## 2024-06-21 HISTORY — DX: Personal history of urinary calculi: Z87.442

## 2024-06-21 HISTORY — DX: Attention-deficit hyperactivity disorder, unspecified type: F90.9

## 2024-06-21 HISTORY — DX: Type 2 diabetes mellitus without complications: E11.9

## 2024-06-21 SURGERY — LITHOTRIPSY, ESWL
Anesthesia: LOCAL | Laterality: Left

## 2024-06-21 NOTE — Progress Notes (Signed)
 Thought procedure scheduled for 06/28/2024. Had breakfast today. Would like to reschedule procedure for 06/28/2024. Dr. Roseann and lithotripsy staff aware.

## 2024-06-24 ENCOUNTER — Ambulatory Visit: Payer: Self-pay | Admitting: Urology

## 2024-06-24 DIAGNOSIS — N201 Calculus of ureter: Secondary | ICD-10-CM

## 2024-06-25 NOTE — Progress Notes (Signed)
 Spoke w/ via phone for pre-op interview---[facility] needs dos---- KUB CBG              COVID test -----patient states asymptomatic no test needed Arrive at -------1200 NPO after MN NO Solid Food.  Clear liquids from MN until---0800 Med rec completed. Pt aware to hold ASA/NSAIDs and supplements per [facility] protocol.  Medications to take morning of surgery -----pain meds as needed Diabetic/Weight loss medication ----- No Alcohol or recreational drugs for 24 hours/Tobacco products for 6 hours ---- Patient instructed to bring blue lithotripsy folder, photo id and insurance card day of surgery. Patient aware to have Driver (ride ) / caregiver for 24 hours after surgery -----Rosina Patient Special Instructions -----wear pants without metal belt buckles, buttons, or zippers and no sandals or flip flops. Pre-Op special Instructions ----- take laxative of choice day before procedure Patient verbalized understanding of instructions that were given at this phone interview. Patient denies shortness of breath, chest pain, fever, cough at this phone interview.

## 2024-06-25 NOTE — Progress Notes (Signed)
 Attempted to call patient to instruct on arrival time and medications etc for ESWL scheduled for Monday. No option for voicemail.

## 2024-06-28 ENCOUNTER — Ambulatory Visit (HOSPITAL_COMMUNITY)

## 2024-06-28 ENCOUNTER — Encounter (HOSPITAL_COMMUNITY): Payer: Self-pay | Admitting: Urology

## 2024-06-28 ENCOUNTER — Encounter (HOSPITAL_COMMUNITY)
Admission: RE | Disposition: A | Discharge status: Home / Self Care | Payer: Self-pay | Source: Home / Self Care | Attending: Urology

## 2024-06-28 ENCOUNTER — Ambulatory Visit (HOSPITAL_COMMUNITY)
Admission: RE | Admit: 2024-06-28 | Discharge: 2024-06-28 | Disposition: A | Discharge status: Home / Self Care | Attending: Urology | Admitting: Urology

## 2024-06-28 ENCOUNTER — Other Ambulatory Visit: Payer: Self-pay

## 2024-06-28 DIAGNOSIS — N201 Calculus of ureter: Secondary | ICD-10-CM | POA: Diagnosis not present

## 2024-06-28 DIAGNOSIS — E119 Type 2 diabetes mellitus without complications: Secondary | ICD-10-CM | POA: Insufficient documentation

## 2024-06-28 HISTORY — PX: EXTRACORPOREAL SHOCK WAVE LITHOTRIPSY: SHX1557

## 2024-06-28 LAB — GLUCOSE, CAPILLARY: Glucose-Capillary: 89 mg/dL (ref 70–99)

## 2024-06-28 SURGERY — LITHOTRIPSY, ESWL
Anesthesia: LOCAL | Laterality: Left

## 2024-06-28 MED ORDER — SODIUM CHLORIDE 0.9 % IV SOLN
INTRAVENOUS | Status: DC
  Administered 2024-06-28: 1000 mL via INTRAVENOUS

## 2024-06-28 MED ORDER — OXYCODONE HCL 5 MG PO TABS: 5.0000 mg | ORAL_TABLET | Freq: Four times a day (QID) | ORAL | 0 refills | Status: AC | PRN

## 2024-06-28 MED ORDER — CIPROFLOXACIN HCL 500 MG PO TABS
500.0000 mg | ORAL_TABLET | ORAL | Status: AC
  Administered 2024-06-28: 500 mg via ORAL
  Filled 2024-06-28: qty 1

## 2024-06-28 MED ORDER — TAMSULOSIN HCL 0.4 MG PO CAPS: 0.4000 mg | ORAL_CAPSULE | Freq: Every day | ORAL | 0 refills | Status: AC

## 2024-06-28 MED ORDER — DIPHENHYDRAMINE HCL 25 MG PO CAPS
25.0000 mg | ORAL_CAPSULE | ORAL | Status: AC
  Administered 2024-06-28: 25 mg via ORAL
  Filled 2024-06-28: qty 1

## 2024-06-28 MED ORDER — DIAZEPAM 5 MG PO TABS
10.0000 mg | ORAL_TABLET | ORAL | Status: AC
  Administered 2024-06-28: 5 mg via ORAL
  Filled 2024-06-28: qty 2

## 2024-06-28 NOTE — Op Note (Signed)
 Operative Note  Procedure:  left shock wave lithotripsy  Please see Operative Note from [facility] scanned into chart.  Milderd Meager, MD [facility] [phone]

## 2024-06-28 NOTE — Interval H&P Note (Signed)
 History and Physical Interval Note:  06/28/2024 1:05 PM  Dale Hines  has presented today for surgery, with the diagnosis of Calculus of ureter.  The various methods of treatment have been discussed with the patient and family. After consideration of risks, benefits and other options for treatment, the patient has consented to  Procedure(s): LITHOTRIPSY, ESWL (Left) as a surgical intervention.  The patient's history has been reviewed, patient examined, no change in status, stable for surgery.  I have reviewed the patient's chart and labs.  Questions were answered to the patient's satisfaction.     [name]

## 2024-06-29 ENCOUNTER — Encounter (HOSPITAL_COMMUNITY): Payer: Self-pay | Admitting: Urology

## 2024-07-01 ENCOUNTER — Ambulatory Visit: Admitting: Urology

## 2024-07-01 ENCOUNTER — Other Ambulatory Visit: Payer: Self-pay

## 2024-07-01 ENCOUNTER — Encounter (HOSPITAL_BASED_OUTPATIENT_CLINIC_OR_DEPARTMENT_OTHER): Payer: Self-pay | Admitting: Radiology

## 2024-07-01 DIAGNOSIS — N132 Hydronephrosis with renal and ureteral calculous obstruction: Secondary | ICD-10-CM | POA: Insufficient documentation

## 2024-07-01 DIAGNOSIS — R109 Unspecified abdominal pain: Secondary | ICD-10-CM | POA: Diagnosis present

## 2024-07-01 LAB — URINALYSIS, ROUTINE W REFLEX MICROSCOPIC
Bilirubin Urine: NEGATIVE
Glucose, UA: NEGATIVE mg/dL
Ketones, ur: NEGATIVE mg/dL
Leukocytes,Ua: NEGATIVE
Nitrite: NEGATIVE
Protein, ur: 100 mg/dL — AB
Specific Gravity, Urine: >=1.03 (ref 1.005–1.030)
pH: 5.5 (ref 5.0–8.0)

## 2024-07-01 LAB — CBC
HCT: 43.2 % (ref 39.0–52.0)
Hemoglobin: 15 g/dL (ref 13.0–17.0)
MCH: 30.7 pg (ref 26.0–34.0)
MCHC: 34.7 g/dL (ref 30.0–36.0)
MCV: 88.3 fL (ref 80.0–100.0)
Platelets: 199 K/uL (ref 150–400)
RBC: 4.89 MIL/uL (ref 4.22–5.81)
RDW: 12.7 % (ref 11.5–15.5)
WBC: 7.9 K/uL (ref 4.0–10.5)
nRBC: 0 % (ref 0.0–0.2)

## 2024-07-01 LAB — URINALYSIS, MICROSCOPIC (REFLEX)

## 2024-07-01 LAB — COMPREHENSIVE METABOLIC PANEL WITH GFR
ALT: 20 U/L (ref 0–44)
AST: 22 U/L (ref 15–41)
Albumin: 4.8 g/dL (ref 3.5–5.0)
Alkaline Phosphatase: 122 U/L (ref 38–126)
Anion gap: 15 (ref 5–15)
BUN: 14 mg/dL (ref 8–23)
CO2: 21 mmol/L — ABNORMAL LOW (ref 22–32)
Calcium: 9.8 mg/dL (ref 8.9–10.3)
Chloride: 100 mmol/L (ref 98–111)
Creatinine, Ser: 1.32 mg/dL — ABNORMAL HIGH (ref 0.61–1.24)
GFR, Estimated: 58 mL/min — ABNORMAL LOW (ref >60)
Glucose, Bld: 114 mg/dL — ABNORMAL HIGH (ref 70–99)
Potassium: 4 mmol/L (ref 3.5–5.1)
Sodium: 135 mmol/L (ref 135–145)
Total Bilirubin: 0.8 mg/dL (ref 0.0–1.2)
Total Protein: 8.2 g/dL — ABNORMAL HIGH (ref 6.5–8.1)

## 2024-07-01 LAB — LIPASE, BLOOD: Lipase: 24 U/L (ref 11–51)

## 2024-07-01 NOTE — ED Triage Notes (Signed)
 Pt complaining of constipation for the past two days. States that he has been getting hot and cold at home and said he had a temp of 92 at home, here it is normal. He is complaining of pain in his abdomen. Pt had lithotripsy done on Monday and has not passed any stones that he knows of.

## 2024-07-01 NOTE — ED Notes (Addendum)
 Pt sts that he had a bowel movement while urinating.

## 2024-07-02 ENCOUNTER — Emergency Department (HOSPITAL_BASED_OUTPATIENT_CLINIC_OR_DEPARTMENT_OTHER)

## 2024-07-02 ENCOUNTER — Emergency Department (HOSPITAL_BASED_OUTPATIENT_CLINIC_OR_DEPARTMENT_OTHER)
Admission: EM | Admit: 2024-07-02 | Discharge: 2024-07-02 | Disposition: A | Discharge status: Home / Self Care | Attending: Emergency Medicine | Admitting: Emergency Medicine

## 2024-07-02 DIAGNOSIS — N132 Hydronephrosis with renal and ureteral calculous obstruction: Secondary | ICD-10-CM | POA: Diagnosis not present

## 2024-07-02 DIAGNOSIS — R109 Unspecified abdominal pain: Secondary | ICD-10-CM

## 2024-07-02 DIAGNOSIS — N200 Calculus of kidney: Secondary | ICD-10-CM

## 2024-07-02 MED ORDER — ONDANSETRON HCL 4 MG PO TABS: 4.0000 mg | ORAL_TABLET | Freq: Four times a day (QID) | ORAL | 0 refills | Status: AC

## 2024-07-02 MED ORDER — ONDANSETRON 4 MG PO TBDP
4.0000 mg | ORAL_TABLET | Freq: Once | ORAL | Status: AC
  Administered 2024-07-02: 4 mg via ORAL
  Filled 2024-07-02: qty 1

## 2024-07-02 NOTE — ED Provider Notes (Signed)
 [place] [facility] Provider Note   CSN: [id number] Arrival date & time: 07/01/24  2309     History Chief Complaint  Patient presents with   Abdominal Pain    HPI Dale Hines is a 68 y.o. male presenting for chief complaint of abdominal pain. States that he had a lithotripsy on monday Nausea without vomitting Had issues with BMS and Urine output after the procedures Improved during his wait with increased UOP and 2x BMS  Patient's recorded medical, surgical, social, medication list and allergies were reviewed in the Snapshot window as part of the initial history.   Review of Systems   Review of Systems  Constitutional:  Negative for chills and fever.  HENT:  Negative for ear pain and sore throat.   Eyes:  Negative for pain and visual disturbance.  Respiratory:  Negative for cough and shortness of breath.   Cardiovascular:  Negative for chest pain and palpitations.  Gastrointestinal:  Positive for abdominal distention and abdominal pain. Negative for vomiting.  Genitourinary:  Negative for dysuria and hematuria.  Musculoskeletal:  Negative for arthralgias and back pain.  Skin:  Negative for color change and rash.  Neurological:  Negative for seizures and syncope.  All other systems reviewed and are negative.   Physical Exam Updated Vital Signs BP (!) 159/94 (BP Location: Left Arm)   Pulse (!) 103   Temp 98.9 F (37.2 C)   Resp 18   Ht 5' 7 (1.702 m)   Wt 81.6 kg   SpO2 100%   BMI 28.19 kg/m  Physical Exam Vitals and nursing note reviewed.  Constitutional:      General: He is not in acute distress.    Appearance: He is well-developed.  HENT:     Head: Normocephalic and atraumatic.  Eyes:     Conjunctiva/sclera: Conjunctivae normal.  Cardiovascular:     Rate and Rhythm: Normal rate and regular rhythm.     Heart sounds: No murmur heard. Pulmonary:     Effort: Pulmonary effort is normal. No respiratory distress.      Breath sounds: Normal breath sounds.  Abdominal:     Palpations: Abdomen is soft.     Tenderness: There is no abdominal tenderness.  Musculoskeletal:        General: No swelling.     Cervical back: Neck supple.  Skin:    General: Skin is warm and dry.     Capillary Refill: Capillary refill takes less than 2 seconds.  Neurological:     Mental Status: He is alert.  Psychiatric:        Mood and Affect: Mood normal.      ED Course/ Medical Decision Making/ A&P    Procedures Procedures   Medications Ordered in ED Medications  ondansetron  (ZOFRAN -ODT) disintegrating tablet 4 mg (4 mg Oral Given 07/02/24 0143)   Medical Decision Making:   [name] is a 68 y.o. male who presented to the ED today with left sided flank pain, detailed above.    Additional history discussed with patient's family/caregivers.  Patient placed on continuous vitals and telemetry monitoring while in ED which was reviewed periodically.  Complete initial physical exam performed, notably the patient  was HDS in NAD.     Reviewed and confirmed nursing documentation for past medical history, family history, social history.    Initial Assessment:   With the patient's presentation of flank pain, most likely diagnosis is Urologic pathology (including UL vs UTI vs pyelo) vs  MSK etiology. Other diagnoses were considered including (but not limited to) gastroenteritis, colitis, small bowel obstruction, appendicitis, cholecystitis, pancreatitis. These are considered less likely due to history of present illness and physical exam findings.   This is most consistent with an acute life/limb threatening illness complicated by underlying chronic conditions.   Initial Plan:  CBC/ Metabolic Panel  to evaluate for underlying infectious/metabolic etiology for patient's abdominal pain  Lipase to evaluate for pancreatitis  CT Ab/pelvis without contrast due to favored urologic over GI etiology for patient's abdominal  pain  Urinalysis and repeat physical assessment to evaluate for UTI/Pyelonpehritis  Empiric management of symptoms with escalating pain control and antiemetics as needed.   Initial Study Results:   Laboratory  All laboratory results reviewed without evidence of clinically relevant pathology.    EKG EKG was reviewed independently. Rate, rhythm, axis, intervals all examined and without medically relevant abnormality. ST segments without concerns for elevations.    Radiology All images reviewed independently. Agree with radiology report at this time.   CT ABDOMEN PELVIS WO CONTRAST Result Date: 07/02/2024 EXAM: CT ABDOMEN AND PELVIS WITHOUT CONTRAST 07/02/2024 02:00:45 AM TECHNIQUE: CT of the abdomen and pelvis was performed without the administration of intravenous contrast. Multiplanar reformatted images are provided for review. Automated exposure control, iterative reconstruction, and/or weight based adjustment of the mA/kV was utilized to reduce the radiation dose to as low as reasonably achievable. COMPARISON: Abdominal radiograph dated 06/28/2024 and CT dated 05/10/2024. CLINICAL HISTORY: Abdominal pain, acute, nonlocalized. Constipation x 2 days with abdominal pain. Lithotripsy 4 days ago. Hx diabetes and kidney stones. FINDINGS: LOWER CHEST: No acute abnormality. LIVER: The liver is unremarkable. GALLBLADDER AND BILE DUCTS: Gallbladder is unremarkable. No biliary ductal dilatation. SPLEEN: No acute abnormality. PANCREAS: No acute abnormality. ADRENAL GLANDS: No acute abnormality. KIDNEYS, URETERS AND BLADDER: Punctate nonobstructing left nephrolithiasis. Mild left hydroureteronephrosis upstream from a 5 mm stone at the left ureterovesical junction (UVJ) and an additional 2 mm stone in the distal left ureter. These may represent fragments of the stone seen on radiograph dated 06/28/2024 status post lithotripsy. Periureteral stranding about the left ureter. GI AND BOWEL: Stomach demonstrates no  acute abnormality. There is no bowel obstruction. No bowel wall thickening. PERITONEUM AND RETROPERITONEUM: No ascites. No free air. VASCULATURE: Aorta is normal in caliber. LYMPH NODES: No lymphadenopathy. REPRODUCTIVE ORGANS: No acute abnormality. BONES AND SOFT TISSUES: No acute osseous abnormality. No focal soft tissue abnormality. IMPRESSION: 1. Mild left hydroureteronephrosis upstream from a 5 mm stone at the left UVJ and an additional 2 mm stone in the distal left ureter, possibly representing fragments of the stone seen on radiograph June 28, 2024, status post lithotripsy. 2. Inflammation about the left ureter. Electronically signed by: Norman Gatlin MD 07/02/2024 02:07 AM EDT RP Workstation: [id number]   DG Abd 1 View Result Date: 06/28/2024 CLINICAL DATA:  Left ureteral stone EXAM: ABDOMEN - 1 VIEW COMPARISON:  June 06/04/2024 FINDINGS: Unchanged 6 mm left ureteral stone projecting at the L3 level. No additional nephrolithiasis identified. Nonobstructive bowel gas pattern. Degenerative changes in the lower lumbar spine. IMPRESSION: Unchanged left ureteral stone at the L3 level. Electronically Signed   By: Michaeline Blanch M.D.   On: 06/28/2024 14:22   Abdomen 1 view (KUB) Result Date: 06/10/2024 CLINICAL DATA:  Left ureteral calculus. EXAM: ABDOMEN - 1 VIEW COMPARISON:  05/27/2024.  CT 05/10/2024 FINDINGS: Stable positioning of 5 mm left ureteral calculus at the L3 level. No additional urolithiasis. Normal bowel gas pattern with small to  moderate volume of stool in the colon. No obstruction. IMPRESSION: Stable positioning of 5 mm left ureteral calculus at the L3 level. Electronically Signed   By: Andrea Gasman M.D.   On: 06/10/2024 14:43     Final Reassessment and Plan:   Patient's history of present on his physical exam findings are most consistent with ongoing nephrolithiasis. Clinically no evidence of infection at this time.  No evidence of severe obstruction though he does have mild  hydronephrosis.  His pain is well-controlled with outpatient regimen.  Zofran  has controlled his nausea vomiting tonight. He is ambulatory tolerating p.o. intake.  He has follow-up with his urologist within 72 hours.  No evidence of severe nephropathy or other acute indication for further intervention.  Supportive care reinforced with strict return precautions regarding development of infectious symptoms reinforced.  Disposition:  I have considered need for hospitalization, however, considering all of the above, I believe this patient is stable for discharge at this time.  Patient/family educated about specific return precautions for given chief complaint and symptoms.  Patient/family educated about follow-up with PCP.     Patient/family expressed understanding of return precautions and need for follow-up. Patient spoken to regarding all imaging and laboratory results and appropriate follow up for these results. All education provided in verbal form with additional information in written form. Time was allowed for answering of patient questions. Patient discharged.    Emergency Department Medication Summary:   Medications  ondansetron  (ZOFRAN -ODT) disintegrating tablet 4 mg (4 mg Oral Given 07/02/24 0143)            Clinical Impression:  1. Abdominal pain, unspecified abdominal location   2. Kidney stone      Discharge   Final Clinical Impression(s) / ED Diagnoses Final diagnoses:  Abdominal pain, unspecified abdominal location  Kidney stone    Rx / DC Orders ED Discharge Orders          Ordered    ondansetron  (ZOFRAN ) 4 MG tablet  Every 6 hours        07/02/24 0229              Jerral Meth, MD 07/02/24 0230

## 2024-07-02 NOTE — ED Notes (Signed)
 Patient transported to CT

## 2024-07-06 ENCOUNTER — Other Ambulatory Visit: Payer: Self-pay

## 2024-07-06 DIAGNOSIS — N201 Calculus of ureter: Secondary | ICD-10-CM

## 2024-07-07 ENCOUNTER — Ambulatory Visit (INDEPENDENT_AMBULATORY_CARE_PROVIDER_SITE_OTHER): Admitting: Urology

## 2024-07-07 ENCOUNTER — Encounter: Payer: Self-pay | Admitting: Urology

## 2024-07-07 ENCOUNTER — Ambulatory Visit (HOSPITAL_BASED_OUTPATIENT_CLINIC_OR_DEPARTMENT_OTHER)
Admission: RE | Admit: 2024-07-07 | Discharge: 2024-07-07 | Disposition: A | Discharge status: Home / Self Care | Source: Ambulatory Visit | Attending: Urology | Admitting: Urology

## 2024-07-07 VITALS — BP 155/85 | HR 65 | Ht 67.0 in | Wt 180.0 lb

## 2024-07-07 DIAGNOSIS — N201 Calculus of ureter: Secondary | ICD-10-CM | POA: Insufficient documentation

## 2024-07-07 LAB — URINALYSIS, ROUTINE W REFLEX MICROSCOPIC
Bilirubin, UA: NEGATIVE
Glucose, UA: NEGATIVE
Ketones, UA: NEGATIVE
Leukocytes,UA: NEGATIVE
Nitrite, UA: NEGATIVE
Protein,UA: NEGATIVE
Specific Gravity, UA: 1.025 (ref 1.005–1.030)
Urobilinogen, Ur: 0.2 mg/dL (ref 0.2–1.0)
pH, UA: 5.5 (ref 5.0–7.5)

## 2024-07-07 LAB — MICROSCOPIC EXAMINATION

## 2024-07-07 NOTE — Progress Notes (Signed)
 Assessment: 1. Ureteral calculus, left     Plan: I personally reviewed the patient's chart including provider notes, lab and imaging results. I personally reviewed the KUB study from today.  I do not appreciate any obvious calcifications in the area of the left distal ureter. Continue tamsulosin . Continue to strain urine. Return to office in 10 days with KUB.  Chief Complaint:  Chief Complaint  Patient presents with   Nephrolithiasis    History of Present Illness:  Dale Hines is a 68 y.o. male who is seen for continued evaluation of a left ureteral calculus. He was seen in the emergency room on 05/10/2024 with left-sided abdominal pain. CT imaging showed a 3 x 6 mm stone in the left proximal ureter near the UPJ with mild left-sided dilation. Urinalysis showed >50 RBCs.  Creatinine normal at 1.21. He was discharged with pain medication and tamsulosin . At his visit on 05/27/24, he had not had any further episodes of pain.  No nausea, vomiting, fever, or chills.  No dysuria or gross hematuria.   No prior history of kidney stones. KUB from 05/27/2024 showed a 6 x 3 mm calcification at the tip of the left L3 transverse process. He elected to attempt spontaneous passage. KUB on 06/10/2024 showed persistence of the calcification.  He elected to proceed with shockwave lithotripsy.  He underwent shockwave lithotripsy on 06/28/2024. He presented to the emergency room on 07/02/2024 with abdominal pain. CT abdomen and pelvis demonstrated mild left hydronephrosis with a 5 mm stone at the left UVJ and an additional 2 mm stone in the distal left ureter. Urinalysis was unremarkable except for microscopic hematuria.  Creatinine slightly elevated at 1.32.  He presents today for follow-up.  His pain has resolved.  He is having regular bowel movements.  He has not required any pain medication.  No fevers or chills.  He is not aware of passing any stones but has not been straining his urine on a  regular basis.  He continues on tamsulosin .  Portions of the above documentation were copied from a prior visit for review purposes only.   Past Medical History:  Past Medical History:  Diagnosis Date   ADHD (attention deficit hyperactivity disorder)    Diabetes mellitus without complication (HCC)    Headache    History of kidney stones     Past Surgical History:  Past Surgical History:  Procedure Laterality Date   EXTRACORPOREAL SHOCK WAVE LITHOTRIPSY Left 06/28/2024   Procedure: LITHOTRIPSY, ESWL;  Surgeon: Roseann Adine PARAS., MD;  Location: [facility];  Service: Urology;  Laterality: Left;   removal of ingrown hairs back     on back    Allergies:  No Known Allergies  Family History:  Family History  Family history unknown: Yes    Social History:  Social History   Tobacco Use   Smoking status: Never  Vaping Use   Vaping status: Never Used  Substance Use Topics   Alcohol use: Yes    Comment: very little   Drug use: No    ROS: Constitutional:  Negative for fever, chills, weight loss CV: Negative for chest pain, previous MI, hypertension Respiratory:  Negative for shortness of breath, wheezing, sleep apnea, frequent cough GI:  Negative for nausea, vomiting, bloody stool, GERD  Physical exam: BP (!) 155/85   Pulse 65   Ht 5' 7 (1.702 m)   Wt 180 lb (81.6 kg)   BMI 28.19 kg/m  GENERAL APPEARANCE:  Well appearing, well developed, well nourished,  NAD HEENT:  Atraumatic, normocephalic, oropharynx clear NECK:  Supple without lymphadenopathy or thyromegaly ABDOMEN:  Soft, non-tender, no masses EXTREMITIES:  Moves all extremities well, without clubbing, cyanosis, or edema NEUROLOGIC:  Alert and oriented x 3, normal gait, CN II-XII grossly intact MENTAL STATUS:  appropriate BACK:  Non-tender to palpation, No CVAT SKIN:  Warm, dry, and intact  Results: U/A: 0-5 WBCs, 0-2 RBCs, few bacteria  KUB from today reviewed.  No obvious calcifications are seen in the  area of the distal left ureter.

## 2024-07-14 ENCOUNTER — Telehealth: Payer: Self-pay | Admitting: Urology

## 2024-07-14 NOTE — Telephone Encounter (Signed)
 Something came up and patient will be out of town. Patient is unable to come in on 08/01 and 07/31. Should I overbook for when you are back during the week of 08/11?

## 2024-07-15 ENCOUNTER — Ambulatory Visit: Admitting: Urology

## 2024-07-16 ENCOUNTER — Ambulatory Visit: Admitting: Urology

## 2024-07-27 ENCOUNTER — Other Ambulatory Visit: Payer: Self-pay

## 2024-07-27 ENCOUNTER — Ambulatory Visit: Admitting: Urology

## 2024-07-27 ENCOUNTER — Ambulatory Visit (HOSPITAL_BASED_OUTPATIENT_CLINIC_OR_DEPARTMENT_OTHER)
Admission: RE | Admit: 2024-07-27 | Discharge: 2024-07-27 | Disposition: A | Discharge status: Home / Self Care | Source: Ambulatory Visit | Attending: Urology | Admitting: Urology

## 2024-07-27 ENCOUNTER — Encounter: Payer: Self-pay | Admitting: Urology

## 2024-07-27 VITALS — BP 151/74 | HR 66 | Ht 67.0 in | Wt 180.0 lb

## 2024-07-27 DIAGNOSIS — N201 Calculus of ureter: Secondary | ICD-10-CM

## 2024-07-27 LAB — URINALYSIS, ROUTINE W REFLEX MICROSCOPIC
Bilirubin, UA: NEGATIVE
Glucose, UA: NEGATIVE
Ketones, UA: NEGATIVE
Leukocytes,UA: NEGATIVE
Nitrite, UA: NEGATIVE
Specific Gravity, UA: 1.03 (ref 1.005–1.030)
Urobilinogen, Ur: 0.2 mg/dL (ref 0.2–1.0)
pH, UA: 5.5 (ref 5.0–7.5)

## 2024-07-27 LAB — MICROSCOPIC EXAMINATION

## 2024-07-27 NOTE — Progress Notes (Signed)
 Assessment: 1. Ureteral calculus, left     Plan: KUB reviewed today with results as noted below. It appears that he has passed the ureteral fragments. Stone prevention discussed. Return to office in 2 months with previsit renal ultrasound.  Chief Complaint:  Chief Complaint  Patient presents with   Nephrolithiasis    History of Present Illness:  Dale Hines is a 68 y.o. male who is seen for continued evaluation of a left ureteral calculus. He was seen in the emergency room on 05/10/2024 with left-sided abdominal pain. CT imaging showed a 3 x 6 mm stone in the left proximal ureter near the UPJ with mild left-sided dilation. Urinalysis showed >50 RBCs.  Creatinine normal at 1.21. He was discharged with pain medication and tamsulosin . At his visit on 05/27/24, he had not had any further episodes of pain.  No nausea, vomiting, fever, or chills.  No dysuria or gross hematuria.   No prior history of kidney stones. KUB from 05/27/2024 showed a 6 x 3 mm calcification at the tip of the left L3 transverse process. He elected to attempt spontaneous passage. KUB on 06/10/2024 showed persistence of the calcification.  He elected to proceed with shockwave lithotripsy.  He underwent shockwave lithotripsy on 06/28/2024. He presented to the emergency room on 07/02/2024 with abdominal pain. CT abdomen and pelvis demonstrated mild left hydronephrosis with a 5 mm stone at the left UVJ and an additional 2 mm stone in the distal left ureter. Urinalysis was unremarkable except for microscopic hematuria.  Creatinine slightly elevated at 1.32.  At his visit on 07/07/24, his pain had resolved.  He was having regular bowel movements. He was not aware of passing any stones but had not been straining his urine on a regular basis.  He continued on tamsulosin . KUB from 07/07/2024 did not show any obvious calcifications in the area of the left distal ureter.  He returns today for follow-up.  He is doing  well.  No flank pain.  No lower urinary tract symptoms.  No dysuria or gross hematuria.  He is not aware of passing any stone fragments since his last visit.  Portions of the above documentation were copied from a prior visit for review purposes only.   Past Medical History:  Past Medical History:  Diagnosis Date   ADHD (attention deficit hyperactivity disorder)    Diabetes mellitus without complication (HCC)    Headache    History of kidney stones     Past Surgical History:  Past Surgical History:  Procedure Laterality Date   EXTRACORPOREAL SHOCK WAVE LITHOTRIPSY Left 06/28/2024   Procedure: LITHOTRIPSY, ESWL;  Surgeon: Roseann Adine PARAS., MD;  Location: [facility];  Service: Urology;  Laterality: Left;   removal of ingrown hairs back     on back    Allergies:  No Known Allergies  Family History:  Family History  Family history unknown: Yes    Social History:  Social History   Tobacco Use   Smoking status: Never  Vaping Use   Vaping status: Never Used  Substance Use Topics   Alcohol use: Yes    Comment: very little   Drug use: No    ROS: Constitutional:  Negative for fever, chills, weight loss CV: Negative for chest pain, previous MI, hypertension Respiratory:  Negative for shortness of breath, wheezing, sleep apnea, frequent cough GI:  Negative for nausea, vomiting, bloody stool, GERD  Physical exam: BP (!) 151/74   Pulse 66   Ht 5' 7 (1.702 m)  Wt 180 lb (81.6 kg)   BMI 28.19 kg/m  GENERAL APPEARANCE:  Well appearing, well developed, well nourished, NAD HEENT:  Atraumatic, normocephalic, oropharynx clear NECK:  Supple without lymphadenopathy or thyromegaly ABDOMEN:  Soft, non-tender, no masses EXTREMITIES:  Moves all extremities well, without clubbing, cyanosis, or edema NEUROLOGIC:  Alert and oriented x 3, normal gait, CN II-XII grossly intact MENTAL STATUS:  appropriate BACK:  Non-tender to palpation, No CVAT SKIN:  Warm, dry, and  intact  Results: U/A: 0-5 WBCs, 3-10 RBCs  KUB shows a nonspecific bowel gas pattern, no obvious bony abnormalities, and no obvious calcifications seen in the area of the left distal ureter.

## 2024-09-21 ENCOUNTER — Ambulatory Visit (HOSPITAL_BASED_OUTPATIENT_CLINIC_OR_DEPARTMENT_OTHER)
Admission: RE | Admit: 2024-09-21 | Discharge: 2024-09-21 | Disposition: A | Discharge status: Home / Self Care | Source: Ambulatory Visit | Attending: Urology | Admitting: Urology

## 2024-09-21 DIAGNOSIS — N201 Calculus of ureter: Secondary | ICD-10-CM | POA: Insufficient documentation

## 2024-09-27 ENCOUNTER — Encounter: Payer: Self-pay | Admitting: Urology

## 2024-09-27 ENCOUNTER — Ambulatory Visit (INDEPENDENT_AMBULATORY_CARE_PROVIDER_SITE_OTHER): Admitting: Urology

## 2024-09-27 VITALS — BP 162/79 | HR 106

## 2024-09-27 DIAGNOSIS — N201 Calculus of ureter: Secondary | ICD-10-CM

## 2024-09-27 LAB — URINALYSIS, ROUTINE W REFLEX MICROSCOPIC
Bilirubin, UA: NEGATIVE
Leukocytes,UA: NEGATIVE
Nitrite, UA: NEGATIVE
RBC, UA: NEGATIVE
Specific Gravity, UA: >=1.03 — AB (ref 1.005–1.030)
Urobilinogen, Ur: 0.2 mg/dL (ref 0.2–1.0)
pH, UA: 5.5 (ref 5.0–7.5)

## 2024-09-27 LAB — MICROSCOPIC EXAMINATION

## 2024-09-27 NOTE — Progress Notes (Signed)
 Assessment: 1. Ureteral calculus     Plan: I reviewed the recent ultrasound results with the patient today. Continue stone prevention  Return to office as needed.  Chief Complaint:  Chief Complaint  Patient presents with   Nephrolithiasis    History of Present Illness:  Dale Hines is a 68 y.o. male who is seen for continued evaluation of a left ureteral calculus. He was seen in the emergency room on 05/10/2024 with left-sided abdominal pain. CT imaging showed a 3 x 6 mm stone in the left proximal ureter near the UPJ with mild left-sided dilation. Urinalysis showed >50 RBCs.  Creatinine normal at 1.21. He was discharged with pain medication and tamsulosin . At his visit on 05/27/24, he had not had any further episodes of pain.  No nausea, vomiting, fever, or chills.  No dysuria or gross hematuria.   No prior history of kidney stones. KUB from 05/27/2024 showed a 6 x 3 mm calcification at the tip of the left L3 transverse process. He elected to attempt spontaneous passage. KUB on 06/10/2024 showed persistence of the calcification.  He elected to proceed with shockwave lithotripsy.  He underwent shockwave lithotripsy on 06/28/2024. He presented to the emergency room on 07/02/2024 with abdominal pain. CT abdomen and pelvis demonstrated mild left hydronephrosis with a 5 mm stone at the left UVJ and an additional 2 mm stone in the distal left ureter. Urinalysis was unremarkable except for microscopic hematuria.  Creatinine slightly elevated at 1.32.  At his visit on 07/07/24, his pain had resolved.  He was having regular bowel movements. He was not aware of passing any stones but had not been straining his urine on a regular basis.  He continued on tamsulosin . KUB from 07/07/2024 did not show any obvious calcifications in the area of the left distal ureter.  At his visit in August 2025, he was doing well.  No flank pain.  No lower urinary tract symptoms.  No dysuria or gross  hematuria.  KUB did not show any obvious calcifications along the expected course of the left ureter. Renal ultrasound from 09/21/2024 showed resolution of the left-sided hydronephrosis and an enlarged prostate.  He returns today for follow-up.  He is doing very well.  No flank pain.  No lower urinary tract symptoms.  Portions of the above documentation were copied from a prior visit for review purposes only.   Past Medical History:  Past Medical History:  Diagnosis Date   ADHD (attention deficit hyperactivity disorder)    Diabetes mellitus without complication (HCC)    Headache    History of kidney stones     Past Surgical History:  Past Surgical History:  Procedure Laterality Date   EXTRACORPOREAL SHOCK WAVE LITHOTRIPSY Left 06/28/2024   Procedure: LITHOTRIPSY, ESWL;  Surgeon: Roseann Adine PARAS., MD;  Location: [facility];  Service: Urology;  Laterality: Left;   removal of ingrown hairs back     on back    Allergies:  No Known Allergies  Family History:  Family History  Family history unknown: Yes    Social History:  Social History   Tobacco Use   Smoking status: Never  Vaping Use   Vaping status: Never Used  Substance Use Topics   Alcohol use: Yes    Comment: very little   Drug use: No    ROS: Constitutional:  Negative for fever, chills, weight loss CV: Negative for chest pain, previous MI, hypertension Respiratory:  Negative for shortness of breath, wheezing, sleep apnea, frequent cough  GI:  Negative for nausea, vomiting, bloody stool, GERD  Physical exam: BP (!) 162/79   Pulse (!) 106  GENERAL APPEARANCE:  Well appearing, well developed, well nourished, NAD HEENT:  Atraumatic, normocephalic, oropharynx clear NECK:  Supple without lymphadenopathy or thyromegaly ABDOMEN:  Soft, non-tender, no masses EXTREMITIES:  Moves all extremities well, without clubbing, cyanosis, or edema NEUROLOGIC:  Alert and oriented x 3, normal gait, CN II-XII grossly  intact MENTAL STATUS:  appropriate BACK:  Non-tender to palpation, No CVAT SKIN:  Warm, dry, and intact  Results: U/A: 0-5 WBCs, 0-2 RBCs

## 2024-12-07 ENCOUNTER — Other Ambulatory Visit: Payer: Self-pay

## 2024-12-07 ENCOUNTER — Ambulatory Visit (INDEPENDENT_AMBULATORY_CARE_PROVIDER_SITE_OTHER): Payer: Self-pay | Admitting: Internal Medicine

## 2024-12-07 ENCOUNTER — Encounter: Payer: Self-pay | Admitting: Internal Medicine

## 2024-12-07 VITALS — BP 130/64 | HR 83 | Temp 98.3°F | Resp 20 | Ht 67.0 in | Wt 192.8 lb

## 2024-12-07 DIAGNOSIS — R42 Dizziness and giddiness: Secondary | ICD-10-CM | POA: Diagnosis not present

## 2024-12-07 DIAGNOSIS — R011 Cardiac murmur, unspecified: Secondary | ICD-10-CM | POA: Diagnosis not present

## 2024-12-07 DIAGNOSIS — H9313 Tinnitus, bilateral: Secondary | ICD-10-CM | POA: Diagnosis not present

## 2024-12-07 DIAGNOSIS — J310 Chronic rhinitis: Secondary | ICD-10-CM

## 2024-12-07 MED ORDER — FLUTICASONE PROPIONATE 50 MCG/ACT NA SUSP
2.0000 | Freq: Every day | NASAL | 5 refills | Status: AC
  Filled 2024-12-07: qty 16, 30d supply, fill #0

## 2024-12-07 NOTE — Progress Notes (Signed)
 "  NEW PATIENT Date of Service/Encounter:   12/07/2024 Referring provider: none-self referred Primary care provider: Abran Jon CROME, MD  Subjective:  Dale Hines is a 68 y.o. male with a PMHx of hyperlipidemia, prediabetes, HTN, BPH presenting today for evaluation of chronic rhinitis.  History obtained from: chart review and patient.   Discussed the use of AI scribe software for clinical note transcription with the patient, who gave verbal consent to proceed.  History of Present Illness Dale Hines is a 68 year old male who presents with chronic mucus production and watery eyes.  Chronic mucus production - Significant clear mucus production, especially after brushing teeth in the morning - Mucus present throughout the day - Mucus localized to the mouth, not associated with rhinorrhea - Ongoing for several years - No regular use of allergy medications, including previously prescribed Flonase   Ocular symptoms - Watery eyes in the morning - Symptom persistent for the past three years - No ocular dryness  Paroxysmal sneezing - Frequent sneezing fits, often in sequences of five to seven sneezes - Sneezing episodes are loud and bothersome to family members  Balance disturbance - History of balance issues beginning approximately one month ago, characterized by loss of balance upon waking - Similar episode occurred a few years ago and resolved - Symptoms resolved after treatment with meclizine and Flonase  prescribed by primary care physician  Auditory symptoms - Buzzing noise in quiet environments - No prior evaluation by otolaryngology  Atopic and allergic history - No history of asthma, eczema, food allergies, or medication allergies  Chart Review:  11/15/24 PCP visit for non-seasonal allergic rhinitis, referred to allergy Allergic rhinitis: - Excessive mucus production, occasional sneezing - Small amount of fluid in left ear - Referral to allergist for  further evaluation - Continue Flonase  nasal spray   PCP visit 11/05/24: vertigo, prescribed meclizine PRN  Past Medical History: Past Medical History:  Diagnosis Date   ADHD (attention deficit hyperactivity disorder)    Diabetes mellitus without complication (HCC)    Headache    History of kidney stones    Medication List:  Current Outpatient Medications  Medication Sig Dispense Refill   amphetamine-dextroamphetamine (ADDERALL) 15 MG tablet Take 15 mg by mouth 2 (two) times daily.      B Complex-C (SUPER B COMPLEX PO) Take 1 tablet by mouth daily with lunch.     metFORMIN  (GLUCOPHAGE ) 1000 MG tablet Take 1 tablet (1,000 mg total) by mouth daily with breakfast. 30 tablet 1   Multiple Vitamin (MULTIVITAMIN WITH MINERALS) TABS tablet Take 1 tablet by mouth daily.     tadalafil (CIALIS) 5 MG tablet Take 5 mg by mouth daily.     tamsulosin  (FLOMAX ) 0.4 MG CAPS capsule Take 1 capsule (0.4 mg total) by mouth daily. 30 capsule 0   traZODone (DESYREL) 100 MG tablet Take 100 mg by mouth at bedtime.     ondansetron  (ZOFRAN ) 4 MG tablet Take 1 tablet (4 mg total) by mouth every 6 (six) hours. (Patient not taking: Reported on 09/27/2024) 30 tablet 0   oxyCODONE  (OXY IR/ROXICODONE ) 5 MG immediate release tablet Take 1 tablet (5 mg total) by mouth every 6 (six) hours as needed for severe pain (pain score 7-10). (Patient not taking: Reported on 09/27/2024) 15 tablet 0   No current facility-administered medications for this visit.   Known Allergies:  Allergies[1] Past Surgical History: Past Surgical History:  Procedure Laterality Date   EXTRACORPOREAL SHOCK WAVE LITHOTRIPSY Left 06/28/2024   Procedure: LITHOTRIPSY, ESWL;  Surgeon: Roseann Adine PARAS., MD;  Location: [facility];  Service: Urology;  Laterality: Left;   removal of ingrown hairs back     on back   Family History: Family History  Family history unknown: Yes   Social History: Alexio lives in a house built 32 years ago, no water  damage, carpet in the bedroom, gas heating, central AC, no roaches, no DM covers on bed or pillows, no smoke exposure, + HEPA filter, [place]   ROS:  All other systems negative except as noted per HPI.  Objective:  Blood pressure 130/64, pulse 83, temperature 98.3 F (36.8 C), temperature source Oral, resp. rate 20, height 5' 7 (1.702 m), weight 192 lb 12.8 oz (87.5 kg), SpO2 98%. Body mass index is 30.2 kg/m. Physical Exam:  General Appearance:  Alert, cooperative, no distress, appears stated age  Head:  Normocephalic, without obvious abnormality, atraumatic  Eyes:  Conjunctiva clear, EOM's intact  Ears EACs normal bilaterally and normal TMs bilaterally  Nose: Nares normal, hypertrophic turbinates, normal mucosa, and no visible anterior polyps  Throat: Lips, tongue normal; teeth and gums normal, normal posterior oropharynx  Neck: Supple, symmetrical  Lungs:   clear to auscultation bilaterally, Respirations unlabored, no coughing  Heart:  II/VI systolic murmur and regular rate and rhythm, Appears well perfused  Extremities: No edema  Skin: Skin color, texture, turgor normal and no rashes or lesions on visualized portions of skin  Neurologic: No gross deficits   Diagnostics:  Labs:  Lab Orders  No laboratory test(s) ordered today     Assessment and Plan  Assessment and Plan Assessment & Plan Chronic rhinoconjunctivitis with vertigo and tinnitus Chronic allergic rhinitis with symptoms of watery eyes, clear mucus production, and sneezing, persisting for several years. Symptoms are year-round and not associated with seasonal changes. No prior allergy testing or treatment with antihistamines.  - Scheduled skin prick test for environmental allergies on January 7th at 1:30 PM. - Instructed to stop antihistamines (Benadryl , Claritin, Allegra, Zyrtec) for three days prior to allergy testing. - Referred to ear, nose, and throat specialist for  evaluation of vertigo and tinnitus. - Advised daily use of Flonase  to manage mucus production and sneezing. 1 spray in each nostril daily  Heart murmur:  - follow up with your primary care provider - no other cardiac symptoms at this time  Follow up : January 7th at 1:30 PM (1-55), no antihistamines for 3 days prior to visit It was a pleasure meeting you in clinic today! Thank you for allowing me to participate in your care.  Rocky Endow, MD [facility]       This note in its entirety was forwarded to the Provider who requested this consultation.  Other: none  Thank you for your kind referral. I appreciate the opportunity to take part in [facility] care. Please do not hesitate to contact me with questions.  Sincerely,  Rocky Endow, MD [facility]          [1] No Known Allergies  "

## 2024-12-07 NOTE — Patient Instructions (Signed)
 Chronic rhinoconjunctivitis with vertigo and tinnitus Chronic allergic rhinitis with symptoms of watery eyes, clear mucus production, and sneezing, persisting for several years. Symptoms are year-round and not associated with seasonal changes. No prior allergy testing or treatment with antihistamines.  - Scheduled skin prick test for environmental allergies on January 7th at 1:30 PM. - Instructed to stop antihistamines (Benadryl , Claritin, Allegra, Zyrtec) for three days prior to allergy testing. - Referred to ear, nose, and throat specialist for evaluation of vertigo and tinnitus. - Advised daily use of Flonase  to manage mucus production and sneezing. 1 spray in each nostril daily  Heart murmur:  - follow up with your primary care provider - no other cardiac symptoms at this time  Follow up : January 7th at 1:30 PM (1-55), no antihistamines for 3 days prior to visit It was a pleasure meeting you in clinic today! Thank you for allowing me to participate in your care.  Rocky Endow, MD [facility]

## 2024-12-20 ENCOUNTER — Other Ambulatory Visit: Payer: Self-pay

## 2024-12-22 ENCOUNTER — Encounter: Payer: Self-pay | Admitting: Internal Medicine

## 2024-12-22 ENCOUNTER — Ambulatory Visit (INDEPENDENT_AMBULATORY_CARE_PROVIDER_SITE_OTHER): Admitting: Internal Medicine

## 2024-12-22 DIAGNOSIS — J310 Chronic rhinitis: Secondary | ICD-10-CM | POA: Insufficient documentation

## 2024-12-22 DIAGNOSIS — J3089 Other allergic rhinitis: Secondary | ICD-10-CM

## 2024-12-22 NOTE — Progress Notes (Signed)
 " Date of Service/Encounter:  12/22/2024  Allergy testing appointment   Initial visit on 12/07/24, seen for chronic rhinoconjunctivitis with vertigo and tinnitus .  Please see that note for additional details.  Today reports for allergy diagnostic testing:    DIAGNOSTICS:  Skin Testing: Environmental allergy panel. Adequate positive and negative controls. Results discussed with patient/family.  Airborne Adult Perc - 12/22/24 1326     Time Antigen Placed 1326    Allergen Manufacturer Jestine    Location Back    Number of Test 55    1. Control-Buffer 50% Glycerol Negative    2. Control-Histamine 3+    3. Bahia Negative    4. Bermuda Negative    5. Johnson Negative    6. [place]  Blue Negative    7. Meadow Fescue Negative    8. Perennial Rye Negative    9. Timothy Negative    10. Ragweed Mix Negative    11. Cocklebur Negative    12. Plantain,  English Negative    13. Baccharis Negative    14. Dog Fennel Negative    15. Russian Thistle Negative    16. Lamb's Quarters Negative    17. Sheep Sorrell Negative    18. Rough Pigweed Negative    19. Marsh Elder, Rough Negative    20. Mugwort, Common Negative    21. Box, Elder Negative    22. Cedar, red Negative    23. Sweet Gum Negative    24. Pecan Pollen Negative    25. Pine Mix Negative    26. Walnut, Black Pollen Negative    27. Red Mulberry Negative    28. Ash Mix Negative    29. Birch Mix Negative    30. Beech American Negative    31. Cottonwood, Eastern Negative    32. Hickory, White Negative    33. Maple Mix Negative    34. Oak, Eastern Mix Negative    35. Sycamore Eastern Negative    36. Alternaria Alternata Negative    37. Cladosporium Herbarum Negative    38. Aspergillus Mix Negative    39. Penicillium Mix Negative    40. Bipolaris Sorokiniana (Helminthosporium) Negative    41. Drechslera Spicifera (Curvularia) Negative    42. Mucor Plumbeus Negative    43. Fusarium Moniliforme Negative    44. Aureobasidium  Pullulans (pullulara) Negative    45. Rhizopus Oryzae Negative    46. Botrytis Cinera Negative    47. Epicoccum Nigrum Negative    48. Phoma Betae Negative    49. Dust Mite Mix Negative    50. Cat Hair 10,000 BAU/ml Negative    51.  Dog Epithelia Negative    52. Mixed Feathers Negative    53. Horse Epithelia Negative    54. Cockroach, German Negative    55. Tobacco Leaf Negative          Intradermal - 12/22/24 1444     Time Antigen Placed 1444    Allergen Manufacturer Other    Location Arm    Number of Test 16    Control Negative    Bahia Negative    Bermuda Negative    Johnson Negative    7 Grass Negative    Ragweed Mix Negative    Weed Mix Negative    Tree Mix Negative    Mold 1 Negative    Mold 2 Negative    Mold 3 Negative    Mold 4 Negative    Mite Mix Negative    Cat  Negative    Dog Negative    Cockroach Negative          Allergy testing results were read and interpreted by myself, documented by clinical staff.  Patient provided with copy of allergy testing along with avoidance measures when indicated.   Rocky Endow, MD  [facility]    ------------------------- Chronic rhinoconjunctivitis with vertigo and tinnitus-NONALLERGIC Chronic allergic rhinitis with symptoms of watery eyes, clear mucus production, and sneezing, persisting for several years. Symptoms are year-round and not associated with seasonal changes. No prior allergy testing or treatment with antihistamines.  - skin and intradermal testing to environmental allergies 12/22/24: negative - Referred to ear, nose, and throat specialist for evaluation of vertigo and tinnitus. - Advised daily use of Flonase  to manage mucus production and sneezing. 1-2 spray in each nostril daily - since symptoms are nonallergic in nature, the best treatment options will be nasal sprays if surgical options are not recommended.   Follow up : as needed It was a pleasure seeing you  again in clinic today! Thank you for allowing me to participate in your care.  Rocky Endow, MD [facility]    "

## 2024-12-22 NOTE — Patient Instructions (Signed)
 Chronic rhinoconjunctivitis with vertigo and tinnitus-NONALLERGIC Chronic allergic rhinitis with symptoms of watery eyes, clear mucus production, and sneezing, persisting for several years. Symptoms are year-round and not associated with seasonal changes. No prior allergy testing or treatment with antihistamines.  - skin and intradermal testing to environmental allergies 12/22/24: negative - Referred to ear, nose, and throat specialist for evaluation of vertigo and tinnitus. - Advised daily use of Flonase  to manage mucus production and sneezing. 1-2 spray in each nostril daily - since symptoms are nonallergic in nature, the best treatment options will be nasal sprays if surgical options are not recommended.   Follow up : as needed It was a pleasure seeing you again in clinic today! Thank you for allowing me to participate in your care.  Rocky Endow, MD [facility]

## 2024-12-24 ENCOUNTER — Encounter (INDEPENDENT_AMBULATORY_CARE_PROVIDER_SITE_OTHER): Payer: Self-pay

## 2025-01-06 ENCOUNTER — Telehealth: Payer: Self-pay | Admitting: Internal Medicine

## 2025-01-06 NOTE — Telephone Encounter (Signed)
 Dale Hines is scheduled with Dr. Masciello for 2/23 at 2:30 pm.

## 2025-02-07 ENCOUNTER — Institutional Professional Consult (permissible substitution) (INDEPENDENT_AMBULATORY_CARE_PROVIDER_SITE_OTHER)
